# Patient Record
Sex: Female | Born: 1985
Health system: Southern US, Community
[De-identification: ages and names within clinical notes are randomized; demographics above are authoritative.]

## PROBLEM LIST (undated history)

## (undated) DIAGNOSIS — Z973 Presence of spectacles and contact lenses: Secondary | ICD-10-CM

## (undated) DIAGNOSIS — F32A Depression, unspecified: Secondary | ICD-10-CM

## (undated) DIAGNOSIS — D649 Anemia, unspecified: Secondary | ICD-10-CM

## (undated) DIAGNOSIS — N939 Abnormal uterine and vaginal bleeding, unspecified: Secondary | ICD-10-CM

## (undated) DIAGNOSIS — R519 Headache, unspecified: Secondary | ICD-10-CM

## (undated) HISTORY — PX: REDUCTION MAMMAPLASTY: SUR839

## (undated) HISTORY — PX: NO PAST SURGERIES: SHX2092

---

## 2006-04-01 ENCOUNTER — Other Ambulatory Visit: Admission: RE | Admit: 2006-04-01 | Discharge: 2006-04-01 | Payer: Self-pay | Admitting: Family Medicine

## 2007-07-22 ENCOUNTER — Other Ambulatory Visit: Admission: RE | Admit: 2007-07-22 | Discharge: 2007-07-22 | Payer: Self-pay | Admitting: Family Medicine

## 2011-12-11 ENCOUNTER — Other Ambulatory Visit: Payer: Self-pay | Admitting: Family Medicine

## 2011-12-11 ENCOUNTER — Other Ambulatory Visit (HOSPITAL_COMMUNITY)
Admission: RE | Admit: 2011-12-11 | Discharge: 2011-12-11 | Disposition: A | Discharge status: Home / Self Care | Payer: Commercial Managed Care - PPO | Source: Ambulatory Visit | Attending: Family Medicine | Admitting: Family Medicine

## 2011-12-11 DIAGNOSIS — Z124 Encounter for screening for malignant neoplasm of cervix: Secondary | ICD-10-CM | POA: Insufficient documentation

## 2014-07-05 ENCOUNTER — Other Ambulatory Visit (HOSPITAL_COMMUNITY)
Admission: RE | Admit: 2014-07-05 | Discharge: 2014-07-05 | Disposition: A | Discharge status: Home / Self Care | Payer: Commercial Managed Care - PPO | Source: Ambulatory Visit | Attending: Family Medicine | Admitting: Family Medicine

## 2014-07-05 ENCOUNTER — Other Ambulatory Visit: Payer: Self-pay | Admitting: Family Medicine

## 2014-07-05 DIAGNOSIS — Z124 Encounter for screening for malignant neoplasm of cervix: Secondary | ICD-10-CM | POA: Insufficient documentation

## 2014-07-07 LAB — CYTOLOGY - PAP

## 2016-09-02 DIAGNOSIS — Z0000 Encounter for general adult medical examination without abnormal findings: Secondary | ICD-10-CM | POA: Diagnosis not present

## 2016-09-02 DIAGNOSIS — Z13220 Encounter for screening for lipoid disorders: Secondary | ICD-10-CM | POA: Diagnosis not present

## 2016-09-06 DIAGNOSIS — H6983 Other specified disorders of Eustachian tube, bilateral: Secondary | ICD-10-CM | POA: Insufficient documentation

## 2016-09-06 DIAGNOSIS — H905 Unspecified sensorineural hearing loss: Secondary | ICD-10-CM | POA: Insufficient documentation

## 2016-09-06 DIAGNOSIS — H9192 Unspecified hearing loss, left ear: Secondary | ICD-10-CM | POA: Diagnosis not present

## 2016-09-06 DIAGNOSIS — J302 Other seasonal allergic rhinitis: Secondary | ICD-10-CM | POA: Diagnosis not present

## 2016-12-21 DIAGNOSIS — N390 Urinary tract infection, site not specified: Secondary | ICD-10-CM | POA: Diagnosis not present

## 2016-12-21 DIAGNOSIS — R319 Hematuria, unspecified: Secondary | ICD-10-CM | POA: Diagnosis not present

## 2017-03-26 DIAGNOSIS — R8290 Unspecified abnormal findings in urine: Secondary | ICD-10-CM | POA: Diagnosis not present

## 2017-03-26 DIAGNOSIS — E559 Vitamin D deficiency, unspecified: Secondary | ICD-10-CM | POA: Diagnosis not present

## 2017-03-26 DIAGNOSIS — Z23 Encounter for immunization: Secondary | ICD-10-CM | POA: Diagnosis not present

## 2017-03-26 DIAGNOSIS — R5382 Chronic fatigue, unspecified: Secondary | ICD-10-CM | POA: Diagnosis not present

## 2017-05-06 DIAGNOSIS — J0100 Acute maxillary sinusitis, unspecified: Secondary | ICD-10-CM | POA: Diagnosis not present

## 2017-06-27 DIAGNOSIS — H6983 Other specified disorders of Eustachian tube, bilateral: Secondary | ICD-10-CM | POA: Diagnosis not present

## 2017-09-17 DIAGNOSIS — Z5181 Encounter for therapeutic drug level monitoring: Secondary | ICD-10-CM | POA: Diagnosis not present

## 2017-09-17 DIAGNOSIS — Z13220 Encounter for screening for lipoid disorders: Secondary | ICD-10-CM | POA: Diagnosis not present

## 2017-09-17 DIAGNOSIS — E559 Vitamin D deficiency, unspecified: Secondary | ICD-10-CM | POA: Diagnosis not present

## 2017-09-17 DIAGNOSIS — Z0000 Encounter for general adult medical examination without abnormal findings: Secondary | ICD-10-CM | POA: Diagnosis not present

## 2017-12-31 DIAGNOSIS — N3001 Acute cystitis with hematuria: Secondary | ICD-10-CM | POA: Diagnosis not present

## 2018-03-25 DIAGNOSIS — L42 Pityriasis rosea: Secondary | ICD-10-CM | POA: Diagnosis not present

## 2020-04-10 ENCOUNTER — Emergency Department (HOSPITAL_COMMUNITY)
Admission: EM | Admit: 2020-04-10 | Discharge: 2020-04-10 | Disposition: A | Discharge status: Home / Self Care | Payer: Commercial Managed Care - PPO | Attending: Emergency Medicine | Admitting: Emergency Medicine

## 2020-04-10 ENCOUNTER — Other Ambulatory Visit: Payer: Self-pay

## 2020-04-10 ENCOUNTER — Encounter (HOSPITAL_COMMUNITY): Payer: Self-pay

## 2020-04-10 ENCOUNTER — Emergency Department (HOSPITAL_COMMUNITY): Payer: Commercial Managed Care - PPO

## 2020-04-10 DIAGNOSIS — R002 Palpitations: Secondary | ICD-10-CM | POA: Diagnosis not present

## 2020-04-10 DIAGNOSIS — M79602 Pain in left arm: Secondary | ICD-10-CM | POA: Diagnosis not present

## 2020-04-10 DIAGNOSIS — Z5321 Procedure and treatment not carried out due to patient leaving prior to being seen by health care provider: Secondary | ICD-10-CM | POA: Diagnosis not present

## 2020-04-10 DIAGNOSIS — R079 Chest pain, unspecified: Secondary | ICD-10-CM | POA: Diagnosis not present

## 2020-04-10 HISTORY — DX: Depression, unspecified: F32.A

## 2020-04-10 LAB — CBC
HCT: 36.1 % (ref 36.0–46.0)
Hemoglobin: 11.2 g/dL — ABNORMAL LOW (ref 12.0–15.0)
MCH: 27.9 pg (ref 26.0–34.0)
MCHC: 31 g/dL (ref 30.0–36.0)
MCV: 89.8 fL (ref 80.0–100.0)
Platelets: 341 10*3/uL (ref 150–400)
RBC: 4.02 MIL/uL (ref 3.87–5.11)
RDW: 15.1 % (ref 11.5–15.5)
WBC: 8.1 10*3/uL (ref 4.0–10.5)
nRBC: 0 % (ref 0.0–0.2)

## 2020-04-10 LAB — BASIC METABOLIC PANEL
Anion gap: 8 (ref 5–15)
BUN: 6 mg/dL (ref 6–20)
CO2: 24 mmol/L (ref 22–32)
Calcium: 8.6 mg/dL — ABNORMAL LOW (ref 8.9–10.3)
Chloride: 107 mmol/L (ref 98–111)
Creatinine, Ser: 0.66 mg/dL (ref 0.44–1.00)
GFR, Estimated: >60 mL/min (ref >60)
Glucose, Bld: 126 mg/dL — ABNORMAL HIGH (ref 70–99)
Potassium: 3.5 mmol/L (ref 3.5–5.1)
Sodium: 139 mmol/L (ref 135–145)

## 2020-04-10 LAB — I-STAT BETA HCG BLOOD, ED (MC, WL, AP ONLY): I-stat hCG, quantitative: <5 m[IU]/mL (ref <5)

## 2020-04-10 LAB — TROPONIN I (HIGH SENSITIVITY): Troponin I (High Sensitivity): <2 ng/L (ref <18)

## 2020-04-10 NOTE — ED Notes (Signed)
Pt called multiple times, no answer 

## 2020-04-10 NOTE — ED Triage Notes (Signed)
Pt presents with acute onset heart palpitations and pain radiating into Left arm. Pt also reports heavy menstrual cycle consistently x3 months, her OB GYMN placed her on continuous oral BC with no change, pt anemic during her last visit. She also received her Moderna booster Saturday

## 2020-05-27 ENCOUNTER — Other Ambulatory Visit: Payer: Commercial Managed Care - PPO

## 2020-05-27 DIAGNOSIS — Z20822 Contact with and (suspected) exposure to covid-19: Secondary | ICD-10-CM

## 2020-05-30 LAB — NOVEL CORONAVIRUS, NAA: SARS-CoV-2, NAA: NOT DETECTED

## 2020-09-07 NOTE — H&P (Signed)
Erika Fleming is an 35 y.o. G1P0010 who is admitted for Robotic Assisted Total Laparoscopic Hysterectomy with Bilateral Salpingectomy for AUB-L with chronic blood loss anemia.  She has been on COCs ([place]) for greater than 10 years and began to have breakthrough bleeding in April 2021. Bleeding persistent with skipping placebo pills.  She was counseled on other forms of contraception to aid with bleeding, including LNG-IUD and medications that may aid in decrease in bleeding such as Oriahnn/Myfembre. She was also counseled on other surgical options such as endometrial ablation/UAE. Patient declined all other options and desires definitive surgical management.  She has made it very clear that she does not desire future fertility and is 100% positive about this.  Work-up: Pap smear (01/2020):  NILM/HRHPV negative TSH (01/2020):  2.00 (wnl) CBC (01/2020): 8.2>9.6/31.1<449 EMB (06/2020):  Very rare benign endometrial fragments are seen in a specimen that consists mainly of fragments of benign endocervical glands, mucus, and abundant blood. TVUS (01/2020):  Uterus 8.43 x 3.68 x 5.02cm  Fibroid 1: 1.98cm, Fibroid 2: 1.41cm, Fibroid 3: 3.18cm, Fibroid 4: 1.54cm  R ovary: 3.08cm, L ovary 3.20cm  Anteverted uterus.  Uterine fibroids - midbody submucosal 2.0 x 2.0 x 1.7cm, midbody submucosal 1.4 x 1.3 x 1.3cm, fundal 3.2 x 2.7 x 2.4cm, fundal pedunculated 1.5 x 1.5 x 1.2cm.  Endometrium difficult to clearly visualize due to submucosal midbody fibroids.  Fibroids push on and distort endometrial cavity.  Bilateral ovaries wnl. No adnexal masses seen.  Patient Active Problem List   Diagnosis Date Noted  . Abnormal uterine bleeding (AUB) 09/08/2020  . Chronic blood loss anemia 09/08/2020  . Chronic seasonal allergic rhinitis 09/06/2016  . Congenital hearing loss of left ear 09/06/2016  . ETD (Eustachian tube dysfunction), bilateral 09/06/2016    MEDICAL/FAMILY/SOCIAL HX: No LMP recorded. (Menstrual status:  Irregular Periods).    Past Medical History:  Diagnosis Date  . Depression     No past surgical history on file.  No family history on file.  Social History:  reports that she has never smoked. She has never used smokeless tobacco. She reports that she does not drink alcohol and does not use drugs.  ALLERGIES/MEDS:  Allergies: No Known Allergies  No medications prior to admission.     Review of Systems  Constitutional: Negative.   HENT: Negative.   Eyes: Negative.   Respiratory: Negative.   Cardiovascular: Negative.   Gastrointestinal: Negative.   Genitourinary: Negative.   Musculoskeletal: Negative.   Skin: Negative.   Neurological: Negative.   Endo/Heme/Allergies: Negative.   Psychiatric/Behavioral: Negative.     There were no vitals taken for this visit.  Gen:  NAD, pleasant Cardio:  RRR Lungs:  CTAB Abd: Soft, non-distended, non-tender throughout Ext:  No bilateral LE edema Pelvic: Labia- unremarkable, vagina - pink moist mucosa, no lesions or abnormal discharge, cervix - no discharge or lesions or CMT, on bimanual exam uterus is normal in size and is non-tender, no CMT, no adnexal fullness/masses  No results found for this or any previous visit (from the past 24 hour(s)).  No results found.   ASSESSMENT/PLAN: Erika Fleming is a 35 y.o. G1P0010  who is admitted for Robotic Assisted Total Laparoscopic Hysterectomy with Bilateral Salpingectomy for AUB-L with chronic blood loss anemia.  - Admit to [facility] - Admit labs (CBC, T&S, COVID screen) - Diet:  NPO - IVF:  Per anesthesia - VTE Prophylaxis:  SCDs - Antibiotics:  Ancef 2g on call to OR - D/C home POD#1  Consents:  I have explained to the patient that this surgery is performed to remove the uterus through several small incisions in the abdomen and that it will result in sterility.  I discussed the risks and benefits of the surgery, including, but not limited to bleeding, including the need for a blood  transfusion, infection, damage to surrounding organs and tissues, damage to bladder, damage to ureters, causing kidney damage, and requiring additional procedures, damage to bowels, resulting in further surgery, postoperative pain, short-term and long-term, scarring on the abdominal wall and intra-abdominally, need for further surgery, need for conversion to an open procedure, development of an incisional hernia, deep vein thrombosis and/or pulmonary embolism, wound infection and/or separation, painful intercourse, urinary leakage, ovarian failure, resulting in menopausal symptoms requiring treatment, fistula formation, complications the course of which cannot be predicted or prevented, and death. Patient was counseled on prophylactic oophorectomy versus ovarian conservation, understanding that ovarian conservation carries a lifetime risk of ovarian cancer of 1 in 52 and a 5-10% risk of reoperation in the future for ovarian pathology.  She understands that prophylactic oophorectomy may result in menopausal symptoms resulting in the need to hormone replacement therapy following surgery.  The patient opts for ovarian conservation. She did, however, give consent for oophoprectomy for abnormal findings that would warrant oophorectomy at the time of surgery. Patient consented for blood products. The pt is aware that bleeding may result in the need for a blood transfusion including HIV (1: 2 million), Hepatitis C (1:2 million), and Hepatitis B (1:200 thousand) and transfusion reaction. Pt voiced understanding of above risks as well as understanding of the indications for blood transfusion.   Drema Dallas, DO [phone] (office)

## 2020-09-08 ENCOUNTER — Encounter (HOSPITAL_BASED_OUTPATIENT_CLINIC_OR_DEPARTMENT_OTHER): Payer: Self-pay | Admitting: Obstetrics and Gynecology

## 2020-09-08 DIAGNOSIS — N939 Abnormal uterine and vaginal bleeding, unspecified: Secondary | ICD-10-CM

## 2020-09-08 DIAGNOSIS — D500 Iron deficiency anemia secondary to blood loss (chronic): Secondary | ICD-10-CM

## 2020-09-12 ENCOUNTER — Other Ambulatory Visit: Payer: Self-pay

## 2020-09-12 ENCOUNTER — Encounter (HOSPITAL_BASED_OUTPATIENT_CLINIC_OR_DEPARTMENT_OTHER): Payer: Self-pay | Admitting: Obstetrics and Gynecology

## 2020-09-12 NOTE — Progress Notes (Incomplete Revision)
Spoke w/ via phone for pre-op interview---pt Lab needs dos----    Urine poct           Lab results------has lab appt 09-18-2020 900 for cbc t & s  COVID test ------09-18-2020 1030 Arrive at -------630 am 09-20-2020 NPO after MN NO Solid Food.  Clear liquids from MN until---530 am then npo Med rec completed Medications to take morning of surgery -----sprintec, citalopram, loratadine, eye drop prn Diabetic medication -----n/a Patient instructed to bring photo id and insurance card day of surgery Patient aware to have Driver (ride ) / caregiver spouse willie    for 24 hours after surgery  Patient Special Instructions -----none Pre-Op special Istructions -----none Patient verbalized understanding of instructions that were given at this phone interview. Patient denies shortness of breath, chest pain, fever, cough at this phone interview.  ekg 04-10-2020 epic  chest xray 04-10-2020 epic Sleep study 2 weeks ago at [facility] normal per pt

## 2020-09-12 NOTE — Progress Notes (Addendum)
Spoke w/ via phone for pre-op interview---pt Lab needs dos----    Urine poct           Lab results------has lab appt 09-18-2020 900 for cbc t & s  COVID test ------09-18-2020 1030 Arrive at -------630 am 09-20-2020 NPO after MN NO Solid Food.  Clear liquids from MN until---530 am then npo Med rec completed Medications to take morning of surgery -----sprintec, citalopram, loratadine, eye drop prn Diabetic medication -----n/a Patient instructed to bring photo id and insurance card day of surgery Patient aware to have Driver (ride ) / caregiver spouse willie    for 24 hours after surgery  Patient Special Instructions -----none Pre-Op special Istructions -----none Patient verbalized understanding of instructions that were given at this phone interview. Patient denies shortness of breath, chest pain, fever, cough at this phone interview.  ekg 04-10-2020 epic  chest xray 04-10-2020 epic abnormal reviewed with dr Marcello Moores brock mda and need note from pt pcp stating aware of abnormal chest xray 04-10-2020 and addressing the issue , called dr Delora Fuel office and spoke with [facility] scheduler and made aware of abnormal chest xray 04-10-2020 and need note from pcp addressing issue per dr Marcello Moores brock mda Sleep study 2 weeks ago at [facility] normal per pt  Addendum: medical clearance dr Arbie Cookey webb dated  09-15-2020 received and on chart for 09-20-2020 surgery

## 2020-09-12 NOTE — Progress Notes (Signed)
YOU ARE SCHEDULED FOR A COVID TEST  09-18-2020  @ 1030 AM.THIS TEST MUST BE DONE BEFORE SURGERY. GO TO  [place]. [place], [place], IT IS APPROXIMATELY 2 MINUTES PAST [employer] ON THE RIGHT AND REMAIN IN YOUR CAR, THIS IS A DRIVE UP TEST. ONCE YOUR COVID TEST IS DONE PLEASE FOLLOW ALL THE QUARANTINE  INSTRUCTIONS GIVEN IN YOUR HANDOUT.      Your procedure is scheduled on 09-20-2020  Report to [facility]   Call this number if you have problems the morning of surgery  :[phone].   OUR ADDRESS IS [place].  WE ARE LOCATED IN [facility].  PLEASE BRING YOUR INSURANCE CARD AND PHOTO ID DAY OF SURGERY.  ONLY ONE PERSON ALLOWED IN FACILITY WAITING AREA.                                     REMEMBER:  DO NOT EAT FOOD, CANDY GUM OR MINTS  AFTER MIDNIGHT . YOU MAY HAVE CLEAR LIQUIDS FROM MIDNIGHT UNTIL 530 AM. NO CLEAR LIQUIDS AFTER 530 AM DAY OF SURGERY.   YOU MAY  BRUSH YOUR TEETH MORNING OF SURGERY AND RINSE YOUR MOUTH OUT, NO CHEWING GUM CANDY OR MINTS.    CLEAR LIQUID DIET   Foods Allowed                                                                     Foods Excluded  Coffee and tea, regular and decaf                             liquids that you cannot  Plain Jell-O any favor except red or purple                                           see through such as: Fruit ices (not with fruit pulp)                                     milk, soups, orange juice  Iced Popsicles                                    All solid food Carbonated beverages, regular and diet                                    Cranberry, grape and apple juices Sports drinks like Gatorade Lightly seasoned clear broth or consume(fat free) Sugar, honey syrup  Sample Menu Breakfast                                Lunch  Supper Cranberry juice                    Beef broth                            Chicken  broth Jell-O                                     Grape juice                           Apple juice Coffee or tea                        Jell-O                                      Popsicle                                                Coffee or tea                        Coffee or tea  _____________________________________________________________________     TAKE THESE MEDICATIONS MORNING OF SURGERY WITH A SIP OF WATER:SPRINTEC, CITALOPRAM, LORATADINE, EYE DROP IF NEEDED  (BRING EYE DROP WITH YOU DAY OF SURGERY)  ONE VISITOR IS ALLOWED IN WAITING ROOM ONLY DAY OF SURGERY.  NO VISITOR MAY SPEND THE NIGHT.  VISITOR ARE ALLOWED TO STAY UNTIL 800 PM.                                    DO NOT WEAR JEWERLY, MAKE UP. DO NOT WEAR LOTIONS, POWDERS, PERFUMES OR DEODORANT. DO NOT SHAVE FOR 24 HOURS PRIOR TO DAY OF SURGERY. MEN MAY SHAVE FACE AND NECK. CONTACTS, GLASSES, OR DENTURES MAY NOT BE WORN TO SURGERY.                                    [facility] IS NOT RESPONSIBLE  FOR ANY BELONGINGS.                                                                    Marland Kitchen           [place] - Preparing for Surgery Before surgery, you can play an important role.  Because skin is not sterile, your skin needs to be as free of germs as possible.  You can reduce the number of germs on your skin by washing with CHG (chlorahexidine gluconate) soap before surgery.  CHG is an antiseptic cleaner which kills germs and bonds with the skin to continue killing germs even after washing. Please DO NOT use if you have an  allergy to CHG or antibacterial soaps.  If your skin becomes reddened/irritated stop using the CHG and inform your nurse when you arrive at Short Stay. Do not shave (including legs and underarms) for at least 48 hours prior to the first CHG shower.  You may shave your face/neck. Please follow these instructions carefully:  1.  Shower with CHG Soap the night before surgery and the  morning of Surgery.  2.   If you choose to wash your hair, wash your hair first as usual with your  normal  shampoo.  3.  After you shampoo, rinse your hair and body thoroughly to remove the  shampoo.                            4.  Use CHG as you would any other liquid soap.  You can apply chg directly  to the skin and wash                      Gently with a scrungie or clean washcloth.  5.  Apply the CHG Soap to your body ONLY FROM THE NECK DOWN.   Do not use on face/ open                           Wound or open sores. Avoid contact with eyes, ears mouth and genitals (private parts).                       Wash face,  Genitals (private parts) with your normal soap.             6.  Wash thoroughly, paying special attention to the area where your surgery  will be performed.  7.  Thoroughly rinse your body with warm water from the neck down.  8.  DO NOT shower/wash with your normal soap after using and rinsing off  the CHG Soap.                9.  Pat yourself dry with a clean towel.            10.  Wear clean pajamas.            11.  Place clean sheets on your bed the night of your first shower and do not  sleep with pets. Day of Surgery : Do not apply any lotions/deodorants the morning of surgery.  Please wear clean clothes to the hospital/surgery center.  FAILURE TO FOLLOW THESE INSTRUCTIONS MAY RESULT IN THE CANCELLATION OF YOUR SURGERY PATIENT SIGNATURE_________________________________  NURSE SIGNATURE__________________________________  ________________________________________________________________________                                                        QUESTIONS Hansel Feinstein PRE OP NURSE PHONE [phone]

## 2020-09-14 ENCOUNTER — Institutional Professional Consult (permissible substitution): Payer: Commercial Managed Care - PPO | Admitting: Plastic Surgery

## 2020-09-18 ENCOUNTER — Encounter (HOSPITAL_COMMUNITY)
Admission: RE | Admit: 2020-09-18 | Discharge: 2020-09-18 | Disposition: A | Discharge status: Home / Self Care | Payer: Commercial Managed Care - PPO | Source: Ambulatory Visit | Attending: Obstetrics and Gynecology | Admitting: Obstetrics and Gynecology

## 2020-09-18 ENCOUNTER — Other Ambulatory Visit: Payer: Self-pay

## 2020-09-18 ENCOUNTER — Other Ambulatory Visit (HOSPITAL_COMMUNITY)
Admission: RE | Admit: 2020-09-18 | Discharge: 2020-09-18 | Disposition: A | Discharge status: Home / Self Care | Payer: Commercial Managed Care - PPO | Source: Ambulatory Visit | Attending: Obstetrics and Gynecology | Admitting: Obstetrics and Gynecology

## 2020-09-18 DIAGNOSIS — Z20822 Contact with and (suspected) exposure to covid-19: Secondary | ICD-10-CM | POA: Insufficient documentation

## 2020-09-18 DIAGNOSIS — Z01818 Encounter for other preprocedural examination: Secondary | ICD-10-CM | POA: Insufficient documentation

## 2020-09-18 LAB — CBC
HCT: 33 % — ABNORMAL LOW (ref 36.0–46.0)
Hemoglobin: 9.8 g/dL — ABNORMAL LOW (ref 12.0–15.0)
MCH: 24.7 pg — ABNORMAL LOW (ref 26.0–34.0)
MCHC: 29.7 g/dL — ABNORMAL LOW (ref 30.0–36.0)
MCV: 83.1 fL (ref 80.0–100.0)
Platelets: 368 10*3/uL (ref 150–400)
RBC: 3.97 MIL/uL (ref 3.87–5.11)
RDW: 19.3 % — ABNORMAL HIGH (ref 11.5–15.5)
WBC: 7.7 10*3/uL (ref 4.0–10.5)
nRBC: 0 % (ref 0.0–0.2)

## 2020-09-18 LAB — SARS CORONAVIRUS 2 (TAT 6-24 HRS): SARS Coronavirus 2: NEGATIVE

## 2020-09-20 ENCOUNTER — Ambulatory Visit (HOSPITAL_BASED_OUTPATIENT_CLINIC_OR_DEPARTMENT_OTHER): Payer: Commercial Managed Care - PPO | Admitting: Anesthesiology

## 2020-09-20 ENCOUNTER — Ambulatory Visit (HOSPITAL_BASED_OUTPATIENT_CLINIC_OR_DEPARTMENT_OTHER)
Admission: RE | Admit: 2020-09-20 | Discharge: 2020-09-21 | Disposition: A | Discharge status: Home / Self Care | Payer: Commercial Managed Care - PPO | Source: Ambulatory Visit | Attending: Obstetrics and Gynecology | Admitting: Obstetrics and Gynecology

## 2020-09-20 ENCOUNTER — Encounter (HOSPITAL_BASED_OUTPATIENT_CLINIC_OR_DEPARTMENT_OTHER): Payer: Self-pay | Admitting: Obstetrics and Gynecology

## 2020-09-20 ENCOUNTER — Encounter (HOSPITAL_BASED_OUTPATIENT_CLINIC_OR_DEPARTMENT_OTHER)
Admission: RE | Disposition: A | Discharge status: Home / Self Care | Payer: Self-pay | Source: Ambulatory Visit | Attending: Obstetrics and Gynecology

## 2020-09-20 DIAGNOSIS — D259 Leiomyoma of uterus, unspecified: Secondary | ICD-10-CM | POA: Insufficient documentation

## 2020-09-20 DIAGNOSIS — N939 Abnormal uterine and vaginal bleeding, unspecified: Secondary | ICD-10-CM

## 2020-09-20 DIAGNOSIS — D500 Iron deficiency anemia secondary to blood loss (chronic): Secondary | ICD-10-CM | POA: Diagnosis not present

## 2020-09-20 HISTORY — DX: Anemia, unspecified: D64.9

## 2020-09-20 HISTORY — DX: Abnormal uterine and vaginal bleeding, unspecified: N93.9

## 2020-09-20 HISTORY — DX: Presence of spectacles and contact lenses: Z97.3

## 2020-09-20 HISTORY — PX: ROBOTIC ASSISTED LAPAROSCOPIC HYSTERECTOMY AND SALPINGECTOMY: SHX6379

## 2020-09-20 HISTORY — DX: Headache, unspecified: R51.9

## 2020-09-20 LAB — POCT PREGNANCY, URINE: Preg Test, Ur: NEGATIVE

## 2020-09-20 LAB — TYPE AND SCREEN
ABO/RH(D): A POS
Antibody Screen: NEGATIVE

## 2020-09-20 LAB — ABO/RH: ABO/RH(D): A POS

## 2020-09-20 SURGERY — XI ROBOTIC ASSISTED LAPAROSCOPIC HYSTERECTOMY AND SALPINGECTOMY
Anesthesia: General | Site: Abdomen | Laterality: Bilateral

## 2020-09-20 MED ORDER — HEMOSTATIC AGENTS (NO CHARGE) OPTIME
TOPICAL | Status: DC | PRN
  Administered 2020-09-20: 1 via TOPICAL

## 2020-09-20 MED ORDER — ONDANSETRON HCL 4 MG/2ML IJ SOLN
INTRAMUSCULAR | Status: AC
  Filled 2020-09-20: qty 2

## 2020-09-20 MED ORDER — DEXAMETHASONE SODIUM PHOSPHATE 10 MG/ML IJ SOLN
INTRAMUSCULAR | Status: DC | PRN
  Administered 2020-09-20: 8 mg via INTRAVENOUS

## 2020-09-20 MED ORDER — LIDOCAINE 2% (20 MG/ML) 5 ML SYRINGE
INTRAMUSCULAR | Status: AC
  Filled 2020-09-20: qty 5

## 2020-09-20 MED ORDER — PANTOPRAZOLE SODIUM 40 MG PO TBEC
DELAYED_RELEASE_TABLET | ORAL | Status: AC
  Filled 2020-09-20: qty 1

## 2020-09-20 MED ORDER — ONDANSETRON HCL 4 MG/2ML IJ SOLN
INTRAMUSCULAR | Status: DC | PRN
  Administered 2020-09-20: 4 mg via INTRAVENOUS

## 2020-09-20 MED ORDER — ONDANSETRON HCL 4 MG/2ML IJ SOLN: 4.0000 mg | Freq: Four times a day (QID) | INTRAMUSCULAR | Status: DC | PRN

## 2020-09-20 MED ORDER — MORPHINE SULFATE (PF) 2 MG/ML IV SOLN
INTRAVENOUS | Status: AC
  Filled 2020-09-20: qty 1

## 2020-09-20 MED ORDER — PANTOPRAZOLE SODIUM 40 MG IV SOLR
40.0000 mg | Freq: Every day | INTRAVENOUS | Status: DC
  Administered 2020-09-20: 40 mg via INTRAVENOUS

## 2020-09-20 MED ORDER — SIMETHICONE 80 MG PO CHEW
80.0000 mg | CHEWABLE_TABLET | Freq: Four times a day (QID) | ORAL | Status: DC | PRN
  Administered 2020-09-20 (×2): 80 mg via ORAL

## 2020-09-20 MED ORDER — FENTANYL CITRATE (PF) 100 MCG/2ML IJ SOLN
INTRAMUSCULAR | Status: AC
  Filled 2020-09-20: qty 2

## 2020-09-20 MED ORDER — ACETAMINOPHEN 500 MG PO TABS
ORAL_TABLET | ORAL | Status: AC
  Filled 2020-09-20: qty 2

## 2020-09-20 MED ORDER — SUGAMMADEX SODIUM 200 MG/2ML IV SOLN
INTRAVENOUS | Status: DC | PRN
  Administered 2020-09-20: 200 mg via INTRAVENOUS

## 2020-09-20 MED ORDER — MIDAZOLAM HCL 5 MG/5ML IJ SOLN
INTRAMUSCULAR | Status: DC | PRN
  Administered 2020-09-20: 2 mg via INTRAVENOUS

## 2020-09-20 MED ORDER — CEFAZOLIN SODIUM-DEXTROSE 2-4 GM/100ML-% IV SOLN
2.0000 g | INTRAVENOUS | Status: AC
  Administered 2020-09-20: 2 g via INTRAVENOUS

## 2020-09-20 MED ORDER — OXYCODONE HCL 5 MG PO TABS
ORAL_TABLET | ORAL | Status: AC
  Filled 2020-09-20: qty 2

## 2020-09-20 MED ORDER — OXYCODONE HCL 5 MG PO TABS
5.0000 mg | ORAL_TABLET | ORAL | Status: DC | PRN
  Administered 2020-09-20: 5 mg via ORAL
  Administered 2020-09-20 (×2): 10 mg via ORAL
  Administered 2020-09-20: 5 mg via ORAL
  Administered 2020-09-21 (×2): 10 mg via ORAL

## 2020-09-20 MED ORDER — KETOROLAC TROMETHAMINE 30 MG/ML IJ SOLN
INTRAMUSCULAR | Status: DC | PRN
  Administered 2020-09-20: 30 mg via INTRAVENOUS

## 2020-09-20 MED ORDER — OXYCODONE HCL 5 MG/5ML PO SOLN: 5.0000 mg | Freq: Once | ORAL | Status: DC | PRN

## 2020-09-20 MED ORDER — DEXAMETHASONE SODIUM PHOSPHATE 10 MG/ML IJ SOLN
INTRAMUSCULAR | Status: AC
  Filled 2020-09-20: qty 1

## 2020-09-20 MED ORDER — LORATADINE 10 MG PO TABS: 10.0000 mg | ORAL_TABLET | Freq: Every day | ORAL | Status: DC

## 2020-09-20 MED ORDER — MORPHINE SULFATE (PF) 4 MG/ML IV SOLN: 1.0000 mg | INTRAVENOUS | Status: DC | PRN

## 2020-09-20 MED ORDER — SIMETHICONE 80 MG PO CHEW
CHEWABLE_TABLET | ORAL | Status: AC
  Filled 2020-09-20: qty 1

## 2020-09-20 MED ORDER — PROPOFOL 10 MG/ML IV BOLUS
INTRAVENOUS | Status: AC
  Filled 2020-09-20: qty 40

## 2020-09-20 MED ORDER — AMISULPRIDE (ANTIEMETIC) 5 MG/2ML IV SOLN: 10.0000 mg | Freq: Once | INTRAVENOUS | Status: DC | PRN

## 2020-09-20 MED ORDER — FERROUS SULFATE 325 (65 FE) MG PO TABS
325.0000 mg | ORAL_TABLET | Freq: Every day | ORAL | Status: DC
  Filled 2020-09-20 (×3): qty 1

## 2020-09-20 MED ORDER — LACTATED RINGERS IV SOLN: INTRAVENOUS | Status: DC

## 2020-09-20 MED ORDER — ACETAMINOPHEN 500 MG PO TABS
1000.0000 mg | ORAL_TABLET | Freq: Once | ORAL | Status: AC
  Administered 2020-09-20: 1000 mg via ORAL

## 2020-09-20 MED ORDER — FENTANYL CITRATE (PF) 250 MCG/5ML IJ SOLN
INTRAMUSCULAR | Status: AC
  Filled 2020-09-20: qty 5

## 2020-09-20 MED ORDER — CEFAZOLIN SODIUM-DEXTROSE 2-4 GM/100ML-% IV SOLN
INTRAVENOUS | Status: AC
  Filled 2020-09-20: qty 100

## 2020-09-20 MED ORDER — MIDAZOLAM HCL 2 MG/2ML IJ SOLN
INTRAMUSCULAR | Status: AC
  Filled 2020-09-20: qty 2

## 2020-09-20 MED ORDER — KETOROLAC TROMETHAMINE 30 MG/ML IJ SOLN: 30.0000 mg | Freq: Once | INTRAMUSCULAR | Status: DC | PRN

## 2020-09-20 MED ORDER — LIDOCAINE 2% (20 MG/ML) 5 ML SYRINGE
INTRAMUSCULAR | Status: DC | PRN
  Administered 2020-09-20: 60 mg via INTRAVENOUS

## 2020-09-20 MED ORDER — PROPOFOL 10 MG/ML IV BOLUS
INTRAVENOUS | Status: DC | PRN
  Administered 2020-09-20: 150 mg via INTRAVENOUS

## 2020-09-20 MED ORDER — PROMETHAZINE HCL 25 MG/ML IJ SOLN: 6.2500 mg | INTRAMUSCULAR | Status: DC | PRN

## 2020-09-20 MED ORDER — FENTANYL CITRATE (PF) 100 MCG/2ML IJ SOLN
25.0000 ug | INTRAMUSCULAR | Status: DC | PRN
  Administered 2020-09-20 (×2): 25 ug via INTRAVENOUS

## 2020-09-20 MED ORDER — SODIUM CHLORIDE 0.9 % IV SOLN
INTRAVENOUS | Status: DC | PRN
  Administered 2020-09-20: 100 mL

## 2020-09-20 MED ORDER — FENTANYL CITRATE (PF) 100 MCG/2ML IJ SOLN
INTRAMUSCULAR | Status: DC | PRN
  Administered 2020-09-20: 25 ug via INTRAVENOUS
  Administered 2020-09-20: 50 ug via INTRAVENOUS
  Administered 2020-09-20: 100 ug via INTRAVENOUS

## 2020-09-20 MED ORDER — SCOPOLAMINE 1 MG/3DAYS TD PT72
MEDICATED_PATCH | TRANSDERMAL | Status: AC
  Filled 2020-09-20: qty 1

## 2020-09-20 MED ORDER — ROCURONIUM BROMIDE 10 MG/ML (PF) SYRINGE
PREFILLED_SYRINGE | INTRAVENOUS | Status: AC
  Filled 2020-09-20: qty 10

## 2020-09-20 MED ORDER — DEXMEDETOMIDINE (PRECEDEX) IN NS 20 MCG/5ML (4 MCG/ML) IV SYRINGE
PREFILLED_SYRINGE | INTRAVENOUS | Status: AC
  Filled 2020-09-20: qty 10

## 2020-09-20 MED ORDER — OXYCODONE HCL 5 MG PO TABS
ORAL_TABLET | ORAL | Status: AC
  Filled 2020-09-20: qty 1

## 2020-09-20 MED ORDER — IBUPROFEN 800 MG PO TABS
800.0000 mg | ORAL_TABLET | Freq: Three times a day (TID) | ORAL | Status: DC
  Administered 2020-09-20 – 2020-09-21 (×2): 800 mg via ORAL

## 2020-09-20 MED ORDER — ACETAMINOPHEN 500 MG PO TABS
1000.0000 mg | ORAL_TABLET | Freq: Four times a day (QID) | ORAL | Status: DC
  Administered 2020-09-20 – 2020-09-21 (×3): 1000 mg via ORAL

## 2020-09-20 MED ORDER — OXYCODONE HCL 5 MG PO TABS: 5.0000 mg | ORAL_TABLET | Freq: Once | ORAL | Status: DC | PRN

## 2020-09-20 MED ORDER — SCOPOLAMINE 1 MG/3DAYS TD PT72
1.0000 | MEDICATED_PATCH | TRANSDERMAL | Status: DC
  Administered 2020-09-20: 1.5 mg via TRANSDERMAL

## 2020-09-20 MED ORDER — CITALOPRAM HYDROBROMIDE 10 MG PO TABS
10.0000 mg | ORAL_TABLET | Freq: Every day | ORAL | Status: DC
  Filled 2020-09-20: qty 1

## 2020-09-20 MED ORDER — DEXMEDETOMIDINE (PRECEDEX) IN NS 20 MCG/5ML (4 MCG/ML) IV SYRINGE
PREFILLED_SYRINGE | INTRAVENOUS | Status: DC | PRN
  Administered 2020-09-20 (×2): 8 ug via INTRAVENOUS
  Administered 2020-09-20 (×2): 4 ug via INTRAVENOUS
  Administered 2020-09-20 (×2): 8 ug via INTRAVENOUS
  Administered 2020-09-20: 4 ug via INTRAVENOUS

## 2020-09-20 MED ORDER — IBUPROFEN 800 MG PO TABS
ORAL_TABLET | ORAL | Status: AC
  Filled 2020-09-20: qty 1

## 2020-09-20 MED ORDER — PANTOPRAZOLE SODIUM 40 MG IV SOLR
INTRAVENOUS | Status: AC
  Filled 2020-09-20: qty 40

## 2020-09-20 MED ORDER — KETOROLAC TROMETHAMINE 30 MG/ML IJ SOLN
INTRAMUSCULAR | Status: AC
  Filled 2020-09-20: qty 1

## 2020-09-20 MED ORDER — ROCURONIUM BROMIDE 10 MG/ML (PF) SYRINGE
PREFILLED_SYRINGE | INTRAVENOUS | Status: DC | PRN
  Administered 2020-09-20: 60 mg via INTRAVENOUS
  Administered 2020-09-20: 20 mg via INTRAVENOUS

## 2020-09-20 MED ORDER — SODIUM CHLORIDE 0.9 % IV SOLN: INTRAVENOUS | Status: DC | PRN

## 2020-09-20 MED ORDER — ONDANSETRON HCL 4 MG PO TABS: 4.0000 mg | ORAL_TABLET | Freq: Four times a day (QID) | ORAL | Status: DC | PRN

## 2020-09-20 MED ORDER — SODIUM CHLORIDE 0.9 % IR SOLN
Status: DC | PRN
  Administered 2020-09-20: 600 mL

## 2020-09-20 SURGICAL SUPPLY — 70 items
APPLICATOR ARISTA FLEXITIP XL (MISCELLANEOUS) ×2 IMPLANT
BARRIER ADHS 3X4 INTERCEED (GAUZE/BANDAGES/DRESSINGS) IMPLANT
CATH FOLEY 3WAY  5CC 16FR (CATHETERS) ×2
CATH FOLEY 3WAY 5CC 16FR (CATHETERS) ×1 IMPLANT
CELLS DAT CNTRL 66122 CELL SVR (MISCELLANEOUS) IMPLANT
COVER BACK TABLE 60X90IN (DRAPES) ×2 IMPLANT
COVER TIP SHEARS 8 DVNC (MISCELLANEOUS) ×1 IMPLANT
COVER TIP SHEARS 8MM DA VINCI (MISCELLANEOUS) ×2
DECANTER SPIKE VIAL GLASS SM (MISCELLANEOUS) ×4 IMPLANT
DEFOGGER SCOPE WARMER CLEARIFY (MISCELLANEOUS) ×2 IMPLANT
DERMABOND ADVANCED (GAUZE/BANDAGES/DRESSINGS) ×1
DERMABOND ADVANCED .7 DNX12 (GAUZE/BANDAGES/DRESSINGS) ×1 IMPLANT
DILATOR CANAL MILEX (MISCELLANEOUS) ×2 IMPLANT
DRAPE ARM DVNC X/XI (DISPOSABLE) ×4 IMPLANT
DRAPE COLUMN DVNC XI (DISPOSABLE) ×1 IMPLANT
DRAPE DA VINCI XI ARM (DISPOSABLE) ×8
DRAPE DA VINCI XI COLUMN (DISPOSABLE) ×2
DRAPE UTILITY 15X26 TOWEL STRL (DRAPES) ×2 IMPLANT
DRAPE UTILITY XL STRL (DRAPES) ×2 IMPLANT
DURAPREP 26ML APPLICATOR (WOUND CARE) ×2 IMPLANT
ELECT REM PT RETURN 9FT ADLT (ELECTROSURGICAL) ×2
ELECTRODE REM PT RTRN 9FT ADLT (ELECTROSURGICAL) ×1 IMPLANT
GLOVE SURG ENC TEXT LTX SZ6.5 (GLOVE) ×6 IMPLANT
GLOVE SURG UNDER POLY LF SZ6.5 (GLOVE) ×12 IMPLANT
HEMOSTAT ARISTA ABSORB 3G PWDR (HEMOSTASIS) ×2 IMPLANT
HOLDER FOLEY CATH W/STRAP (MISCELLANEOUS) ×2 IMPLANT
IRRIG SUCT STRYKERFLOW 2 WTIP (MISCELLANEOUS) ×2
IRRIGATION SUCT STRKRFLW 2 WTP (MISCELLANEOUS) ×1 IMPLANT
IV NS 1000ML (IV SOLUTION) ×2
IV NS 1000ML BAXH (IV SOLUTION) ×1 IMPLANT
KIT TURNOVER CYSTO (KITS) ×2 IMPLANT
LEGGING LITHOTOMY PAIR STRL (DRAPES) ×2 IMPLANT
NS IRRIG 500ML POUR BTL (IV SOLUTION) ×2 IMPLANT
OBTURATOR OPTICAL STANDARD 8MM (TROCAR)
OBTURATOR OPTICAL STND 8 DVNC (TROCAR)
OBTURATOR OPTICALSTD 8 DVNC (TROCAR) IMPLANT
OCCLUDER COLPOPNEUMO (BALLOONS) ×2 IMPLANT
PACK ROBOT WH (CUSTOM PROCEDURE TRAY) ×2 IMPLANT
PACK ROBOTIC GOWN (GOWN DISPOSABLE) ×2 IMPLANT
PACK TRENDGUARD 450 HYBRID PRO (MISCELLANEOUS) IMPLANT
PAD OB MATERNITY 4.3X12.25 (PERSONAL CARE ITEMS) ×2 IMPLANT
PAD PREP 24X48 CUFFED NSTRL (MISCELLANEOUS) ×2 IMPLANT
PROTECTOR NERVE ULNAR (MISCELLANEOUS) ×2 IMPLANT
RTRCTR WOUND ALEXIS 18CM MED (MISCELLANEOUS)
RTRCTR WOUND ALEXIS 18CM SML (INSTRUMENTS)
SAVER CELL AAL HAEMONETICS (INSTRUMENTS) IMPLANT
SCISSORS LAP 5X45 EPIX DISP (ENDOMECHANICALS) IMPLANT
SEAL CANN UNIV 5-8 DVNC XI (MISCELLANEOUS) ×4 IMPLANT
SEAL XI 5MM-8MM UNIVERSAL (MISCELLANEOUS) ×8
SEALER VESSEL DA VINCI XI (MISCELLANEOUS) ×1
SEALER VESSEL EXT DVNC XI (MISCELLANEOUS) ×1 IMPLANT
SET IRRIG Y TYPE TUR BLADDER L (SET/KITS/TRAYS/PACK) IMPLANT
SET TRI-LUMEN FLTR TB AIRSEAL (TUBING) ×2 IMPLANT
SOL ANTI FOG 6CC (MISCELLANEOUS) ×1 IMPLANT
SOLUTION ANTI FOG 6CC (MISCELLANEOUS) ×1
SUT VIC AB 0 CT1 27 (SUTURE) ×4
SUT VIC AB 0 CT1 27XBRD ANBCTR (SUTURE) ×2 IMPLANT
SUT VICRYL 0 UR6 27IN ABS (SUTURE) IMPLANT
SUT VICRYL RAPIDE 4/0 PS 2 (SUTURE) ×6 IMPLANT
SUT VLOC 180 0 9IN  GS21 (SUTURE) ×4
SUT VLOC 180 0 9IN GS21 (SUTURE) ×2 IMPLANT
TIP RUMI ORANGE 6.7MMX12CM (TIP) IMPLANT
TIP UTERINE 5.1X6CM LAV DISP (MISCELLANEOUS) IMPLANT
TIP UTERINE 6.7X10CM GRN DISP (MISCELLANEOUS) IMPLANT
TIP UTERINE 6.7X6CM WHT DISP (MISCELLANEOUS) IMPLANT
TIP UTERINE 6.7X8CM BLUE DISP (MISCELLANEOUS) ×2 IMPLANT
TOWEL OR 17X26 10 PK STRL BLUE (TOWEL DISPOSABLE) ×2 IMPLANT
TRENDGUARD 450 HYBRID PRO PACK (MISCELLANEOUS)
TROCAR PORT AIRSEAL 8X120 (TROCAR) ×2 IMPLANT
WATER STERILE IRR 1000ML POUR (IV SOLUTION) IMPLANT

## 2020-09-20 NOTE — Anesthesia Preprocedure Evaluation (Addendum)
Anesthesia Evaluation  Patient identified by MRN, date of birth, ID band Patient awake    Reviewed: Allergy & Precautions, NPO status , Patient's Chart, lab work & pertinent test results  Airway Mallampati: I  TM Distance: >3 FB Neck ROM: Full    Dental no notable dental hx.    Pulmonary neg pulmonary ROS,    Pulmonary exam normal breath sounds clear to auscultation       Cardiovascular negative cardio ROS Normal cardiovascular exam Rhythm:Regular Rate:Normal  ECG: NSR, rate 82   Neuro/Psych  Headaches, PSYCHIATRIC DISORDERS Depression    GI/Hepatic negative GI ROS, Neg liver ROS,   Endo/Other  negative endocrine ROS  Renal/GU negative Renal ROS     Musculoskeletal negative musculoskeletal ROS (+)   Abdominal (+) + obese,   Peds  Hematology  (+) anemia ,   Anesthesia Other Findings Abnormal Uterine Bleeding   Reproductive/Obstetrics hcg negative                            Anesthesia Physical Anesthesia Plan  ASA: II  Anesthesia Plan: General   Post-op Pain Management:    Induction: Intravenous  PONV Risk Score and Plan: 4 or greater and Ondansetron, Dexamethasone, Midazolam, Scopolamine patch - Pre-op and Treatment may vary due to age or medical condition  Airway Management Planned: Oral ETT  Additional Equipment:   Intra-op Plan:   Post-operative Plan: Extubation in OR  Informed Consent: I have reviewed the patients History and Physical, chart, labs and discussed the procedure including the risks, benefits and alternatives for the proposed anesthesia with the patient or authorized representative who has indicated his/her understanding and acceptance.     Dental advisory given  Plan Discussed with: CRNA  Anesthesia Plan Comments:        Anesthesia Quick Evaluation

## 2020-09-20 NOTE — Anesthesia Postprocedure Evaluation (Signed)
Anesthesia Post Note  Patient: Erika Fleming  Procedure(s) Performed: XI ROBOTIC ASSISTED LAPAROSCOPIC HYSTERECTOMY AND BILATER SALPINGECTOMY (Bilateral Abdomen)     Patient location during evaluation: PACU Anesthesia Type: General Level of consciousness: awake Pain management: pain level controlled Vital Signs Assessment: post-procedure vital signs reviewed and stable Respiratory status: spontaneous breathing, nonlabored ventilation, respiratory function stable and patient connected to nasal cannula oxygen Cardiovascular status: blood pressure returned to baseline and stable Postop Assessment: no apparent nausea or vomiting Anesthetic complications: no   No complications documented.  Last Vitals:  Vitals:   09/20/20 1150 09/20/20 1231  BP: (!) 141/81 (!) 144/91  Pulse: 70 74  Resp: 16 20  Temp: 36.4 C 36.4 C  SpO2: 100% 100%    Last Pain:  Vitals:   09/20/20 1150  TempSrc:   PainSc: 3                  Detric Scalisi P Khyre Germond

## 2020-09-20 NOTE — Anesthesia Procedure Notes (Signed)
Procedure Name: Intubation Date/Time: 09/20/2020 8:27 AM Performed by: Bonney Aid, CRNA Pre-anesthesia Checklist: Patient identified, Emergency Drugs available, Suction available and Patient being monitored Patient Re-evaluated:Patient Re-evaluated prior to induction Oxygen Delivery Method: Circle system utilized Preoxygenation: Pre-oxygenation with 100% oxygen Induction Type: IV induction Ventilation: Mask ventilation without difficulty Laryngoscope Size: Mac and 3 Grade View: Grade I Tube type: Oral Tube size: 7.0 mm Number of attempts: 1 Airway Equipment and Method: Stylet Placement Confirmation: ETT inserted through vocal cords under direct vision,  positive ETCO2 and breath sounds checked- equal and bilateral Secured at: 21 cm Tube secured with: Tape Dental Injury: Teeth and Oropharynx as per pre-operative assessment

## 2020-09-20 NOTE — Op Note (Signed)
Pre Op Dx:   1. Abnormal uterine bleeding 2. Uterine fibroids 3. Chronic blood loss anemia Post Op Dx:   Same as pre-op  Procedure:  Robotic Assisted Total Laparoscopic Hysterectomy with Bilateral Salpingectomy   Surgeon:  Dr. Drema Dallas Assistants:  Dr. Christophe Louis Anesthesia:  General   EBL:  50cc  IVF:  38cc UOP:  150   Drains:  Foley catheter - removed at conclusion of case Specimen removed:  Uterus, cervix, bilateral fallopian tubes - all sent to pathology Device(s) implanted: None Case Type:  Clean-contaminated Findings:  Uterus tubular in shape with a ~4cm fundal fibroid. Normal-appearing bilateral fallopian tubes and ovaries. Normal liver contours and gallbladder. Complications: None Indications:  35 y.o. G1P0010 with abnormal uterine bleeding, uterine fibroids, and chronic blood loss anemia who desired definitive surgical management and does not desire future fertility. Description of each procedure:  After informed consent the patient was taken to the operating room and placed in dorsal supine position where general endotracheal anesthesia was administered and found to be adequate.  She was placed in dorsal lithotomy position with her arms tucked.  She was prepped and draped in the usual sterile fashion. A RUMI uterine manipulator with the Koh cup and a Foley catheter were placed.  A timeout was called and the procedure confirmed.    A 47mm supraumbilical incision was made and a trochar was used to enter the abdomen under direct visualization.  Pneumoperitoneum was established and atraumatic entry confirmed. Three additional 49mm ports were placed on either side of the umbilicus and an 28mm port was placed in the left upper quadrant under direct visualization. All port sites were injected with 10cc local anesthetic. The pelvis was bathed in a 60cc Ropivicaine solution.  The patient was placed in Trendelenburg position and the [employer] robotic device was docked.  Next, attention  was turned to the console where the hysterectomy was performed.  The right fallopian tube was divided at the mesosalpinx. The uteroovarian anastamosis was divided and the right round ligament was divided.  This process was repeated on the contralateral side.  The anterior leaflet of the peritoneum was divided to create a bladder flap. The uterine artery and vein were skeletonized and desiccated superior to the Koh cup.  This process was repeated on the contralateral side.  Uterine blanching was observed.  A circumferential colpotomy was created along the ridge of the Koh cup and the uterus was passed off the field.  The vaginal occluder was placed in the vagina to maintain pneumoperitoneum.  The vaginal cuff was then closed with V-loc suture. Hemostasis confirmed. Arista powder was placed on the vaginal cuff and adnexa bilaterally.  The [employer] Vinc[employer] robotic device was undocked and all ports were visualized.   The pneumoperitoneum was reduced completely with the assistance of two deep breaths and all ports were removed.  The skin was closed with 4-0 Vicryl in subcuticular fashion with skin glue placed atop each port site.   The vagina was inspected and there were no vaginal tears noted at the introitus and no foreign objects remaining in the vagina. Foley catheter was removed. The patient was returned to dorsal supine position, awakened and extubated in the OR having appeared to tolerate the procedure well.  All sponge, needle, and instrument counts were correct x 2 at the end of the case.  Disposition:  PACU  Drema Dallas, DO

## 2020-09-20 NOTE — Interval H&P Note (Signed)
History and Physical Interval Note:  09/20/2020 8:00 AM  Erika Fleming  has presented today for surgery, with the diagnosis of Abnormal Uterine Bleeding (N93.9).  The various methods of treatment have been discussed with the patient and family. After consideration of risks, benefits and other options for treatment, the patient has consented to  Procedure(s): XI ROBOTIC ASSISTED LAPAROSCOPIC HYSTERECTOMY AND BILATER SALPINGECTOMY (Bilateral) as a surgical intervention.  The patient's history has been reviewed, patient examined, no change in status, stable for surgery.  I have reviewed the patient's chart and labs.  Questions were answered to the patient's satisfaction.     Drema Dallas

## 2020-09-20 NOTE — Transfer of Care (Signed)
Immediate Anesthesia Transfer of Care Note  Patient: Erika Fleming  Procedure(s) Performed: XI ROBOTIC ASSISTED LAPAROSCOPIC HYSTERECTOMY AND BILATER SALPINGECTOMY (Bilateral Abdomen)  Patient Location: PACU  Anesthesia Type:General  Level of Consciousness: drowsy  Airway & Oxygen Therapy: Patient Spontanous Breathing and Patient connected to nasal cannula oxygen  Post-op Assessment: Report given to RN  Post vital signs: Reviewed and stable  Last Vitals:  Vitals Value Taken Time  BP 111/73 09/20/20 1046  Temp    Pulse 64 09/20/20 1049  Resp 16 09/20/20 1049  SpO2 100 % 09/20/20 1049  Vitals shown include unvalidated device data.  Last Pain:  Vitals:   09/20/20 0715  TempSrc: Oral  PainSc: 0-No pain      Patients Stated Pain Goal: 3 (04/10/27 1188)  Complications: No complications documented.

## 2020-09-21 ENCOUNTER — Encounter (HOSPITAL_BASED_OUTPATIENT_CLINIC_OR_DEPARTMENT_OTHER): Payer: Self-pay | Admitting: Obstetrics and Gynecology

## 2020-09-21 DIAGNOSIS — N939 Abnormal uterine and vaginal bleeding, unspecified: Secondary | ICD-10-CM | POA: Diagnosis not present

## 2020-09-21 LAB — SURGICAL PATHOLOGY

## 2020-09-21 MED ORDER — IBUPROFEN 800 MG PO TABS
ORAL_TABLET | ORAL | Status: AC
  Filled 2020-09-21: qty 1

## 2020-09-21 MED ORDER — OXYCODONE HCL 5 MG PO TABS
ORAL_TABLET | ORAL | Status: AC
  Filled 2020-09-21: qty 2

## 2020-09-21 MED ORDER — ACETAMINOPHEN 500 MG PO TABS: 1000.0000 mg | ORAL_TABLET | Freq: Four times a day (QID) | ORAL | 3 refills | Status: DC | PRN

## 2020-09-21 MED ORDER — OXYCODONE HCL 5 MG PO TABS: 5.0000 mg | ORAL_TABLET | ORAL | 0 refills | Status: DC | PRN

## 2020-09-21 MED ORDER — ACETAMINOPHEN 500 MG PO TABS
ORAL_TABLET | ORAL | Status: AC
  Filled 2020-09-21: qty 2

## 2020-09-21 MED ORDER — IBUPROFEN 800 MG PO TABS
800.0000 mg | ORAL_TABLET | Freq: Three times a day (TID) | ORAL | 3 refills | Status: DC
Start: 2020-09-21 — End: 2020-11-08

## 2020-09-21 NOTE — Discharge Summary (Signed)
Physician Discharge Summary  Patient ID: Erika Fleming MRN: [id number] DOB/AGE: Nov 25, 1985 35 y.o.  Admit date: 09/20/2020 Discharge date: 09/21/2020  Admission Diagnoses: Abnormal uterine bleeding Chronic blood loss anemia Uterine fibroids  Discharge Diagnoses:  Active Problems:   Abnormal uterine bleeding (AUB)   Chronic blood loss anemia Uterine fibroids  Procedure(s): Robotic Assisted Total Laparoscopic Hysterectomy with Bilateral Salpingectomy  Discharged Condition: good  Hospital Course: Patient was admitted on 09/20/2020 for the above named procedure for the above diagnoses. She had a normal post-operative course. On the day of discharge, her pain was well-controlled, tolerating a regular diet, she was ambulating, voiding spontaneously, and passing flatus. She was discharged home in stable condition.  Consults: None  Significant Diagnostic Studies: None  Treatments: surgery: See above  Discharge Exam: Blood pressure 107/66, pulse 70, temperature 98.6 F (37 C), resp. rate 16, height 5\' 4"  (1.626 m), weight 86.2 kg, last menstrual period 09/12/2020, SpO2 98 %. Gen:  NAD, pleasant and cooperative Cardio:  RRR Lungs:  CTAB Abd:  Normoactive bowel sounds, soft, non-distended, non-tender to palpation, laparoscopic port sites with skin glue atop and incisions c/d/i Ext:  No bilateral LE edema or calf tenderness   Disposition: Discharge disposition: 01-Home or Self Care       Discharge Instructions    Discharge patient   Complete by: As directed    Discharge disposition: 01-Home or Self Care   Discharge patient date: 09/21/2020     Allergies as of 09/21/2020   No Known Allergies     Medication List    STOP taking these medications   norgestimate-ethinyl estradiol 0.25-35 MG-MCG tablet Commonly known as: ORTHO-CYCLEN     TAKE these medications   acetaminophen 500 MG tablet Commonly known as: TYLENOL Take 2 tablets (1,000 mg total) by mouth every 6 (six)  hours as needed.   citalopram 10 MG tablet Commonly known as: CELEXA Take 10 mg by mouth daily.   ibuprofen 800 MG tablet Commonly known as: ADVIL Take 1 tablet (800 mg total) by mouth every 8 (eight) hours.   Iron 325 (65 Fe) MG Tabs Take by mouth in the morning and at bedtime.   loratadine 10 MG tablet Commonly known as: CLARITIN Take 10 mg by mouth daily.   OVER THE COUNTER MEDICATION Eye drop prn   oxyCODONE 5 MG immediate release tablet Commonly known as: Oxy IR/ROXICODONE Take 1-2 tablets (5-10 mg total) by mouth every 4 (four) hours as needed for moderate pain.       Follow-up Information    Drema Dallas, DO Follow up in 3 week(s).   Specialty: Obstetrics and Gynecology Why: Please keep your 3 week post-operative visit. Contact information: [address] [place] [address] [phone]               Signed: Drema Dallas 09/21/2020, 6:45 AM

## 2020-09-21 NOTE — Discharge Instructions (Signed)
Total Laparoscopic Hysterectomy, Care After The following information offers guidance on how to care for yourself after your procedure. Your health care provider may also give you more specific instructions. If you have problems or questions, contact your health care provider. What can I expect after the procedure? After the procedure, it is common to have:  Pain, bruising, and numbness around your incisions.  Tiredness (fatigue).  Poor appetite.  Less interest in sex.  Vaginal discharge or bleeding. You will need to use a sanitary pad after this procedure.  Feelings of sadness or other emotions. If your ovaries were also removed, it is also common to have symptoms of menopause, such as hot flashes, night sweats, and lack of sleep (insomnia). Follow these instructions at home: Medicines  Take over-the-counter and prescription medicines only as told by your health care provider.  Ask your health care provider if the medicine prescribed to you: ? Requires you to avoid driving or using machinery. ? Can cause constipation. You may need to take these actions to prevent or treat constipation:  Drink enough fluid to keep your urine pale yellow.  Take over-the-counter or prescription medicines.  Eat foods that are high in fiber, such as beans, whole grains, and fresh fruits and vegetables.  Limit foods that are high in fat and processed sugars, such as fried or sweet foods. Incision care  Follow instructions from your health care provider about how to take care of your incisions. Make sure you: ? Wash your hands with soap and water for at least 20 seconds before and after you change your bandage (dressing). If soap and water are not available, use hand sanitizer. ? Change your dressing as told by your health care provider. ? Leave stitches (sutures), skin glue, or adhesive strips in place. These skin closures may need to stay in place for 2 weeks or longer. If adhesive strip edges start  to loosen and curl up, you may trim the loose edges. Do not remove adhesive strips completely unless your health care provider tells you to do that.  Check your incision areas every day for signs of infection. Check for: ? More redness, swelling, or pain. ? Fluid or blood. ? Warmth. ? Pus or a bad smell.   Activity  Rest as told by your health care provider.  Avoid sitting for a long time without moving. Get up to take short walks every 1-2 hours. This is important to improve blood flow and breathing. Ask for help if you feel weak or unsteady.  Return to your normal activities as told by your health care provider. Ask your health care provider what activities are safe for you.  Do not lift anything that is heavier than 10 lb (4.5 kg), or the limit that you are told, for one month after surgery or until your health care provider says that it is safe.  If you were given a sedative during the procedure, it can affect you for several hours. Do not drive or operate machinery until your health care provider says that it is safe.   Lifestyle  Do not use any products that contain nicotine or tobacco. These products include cigarettes, chewing tobacco, and vaping devices, such as e-cigarettes. These can delay healing after surgery. If you need help quitting, ask your health care provider.  Do not drink alcohol until your health care provider approves. General instructions  Do not douche, use tampons, or have sex for at least 6 weeks, or as told by your health   care provider.  If you struggle with physical or emotional changes after your procedure, speak with your health care provider or a therapist.  Do not take baths, swim, or use a hot tub until your health care provider approves. You may only be allowed to take showers for 2-3 weeks.  Keep your dressing dry until your health care provider says it can be removed.  Try to have someone at home with you for the first 1-2 weeks to help with your  daily chores.  Wear compression stockings as told by your health care provider. These stockings help to prevent blood clots and reduce swelling in your legs.  Keep all follow-up visits. This is important.   Contact a health care provider if:  You have any of these signs of infection: ? Chills or a fever. ? More redness, swelling, or pain around an incision. ? Fluid or blood coming from an incision. ? Warmth coming from an incision. ? Pus or a bad smell coming from an incision.  An incision opens.  You feel dizzy or light-headed.  You have pain or bleeding when you urinate, or you are unable to urinate.  You have abnormal vaginal discharge.  You have pain that does not get better with medicine. Get help right away if:  You have a fever and your symptoms suddenly get worse.  You have severe abdominal pain.  You have chest pain or shortness of breath.  You faint.  You have pain, swelling, or redness in your leg.  You have heavy vaginal bleeding with blood clots, soaking through a sanitary pad in less than 1 hour. These symptoms may represent a serious problem that is an emergency. Do not wait to see if the symptoms will go away. Get medical help right away. Call your local emergency services (911 in the [place] Do not drive yourself to the hospital. Summary  After the procedure, it is common to have pain and bruising around your incisions.  Do not take baths, swim, or use a hot tub until your health care provider approves.  Do not lift anything that is heavier than 10 lb (4.5 kg), or the limit that you are told, for one month after surgery or until your health care provider says that it is safe.  Tell your health care provider if you have any signs or symptoms of infection after the procedure.  Get help right away if you have severe abdominal pain, chest pain, shortness of breath, or heavy bleeding from your vagina. This information is not intended to replace advice given  to you by your health care provider. Make sure you discuss any questions you have with your health care provider. Document Revised: 01/28/2020 Document Reviewed: 01/28/2020 Elsevier Patient Education  2021 [employer]  

## 2020-10-20 ENCOUNTER — Other Ambulatory Visit: Payer: Self-pay | Admitting: Family Medicine

## 2020-10-20 DIAGNOSIS — R9389 Abnormal findings on diagnostic imaging of other specified body structures: Secondary | ICD-10-CM

## 2020-10-27 ENCOUNTER — Ambulatory Visit
Admission: RE | Admit: 2020-10-27 | Discharge: 2020-10-27 | Disposition: A | Discharge status: Home / Self Care | Payer: Commercial Managed Care - PPO | Source: Ambulatory Visit | Attending: Family Medicine | Admitting: Family Medicine

## 2020-10-27 ENCOUNTER — Other Ambulatory Visit: Payer: Self-pay

## 2020-10-27 DIAGNOSIS — R9389 Abnormal findings on diagnostic imaging of other specified body structures: Secondary | ICD-10-CM

## 2020-10-27 MED ORDER — IOPAMIDOL (ISOVUE-300) INJECTION 61%
75.0000 mL | Freq: Once | INTRAVENOUS | Status: AC | PRN
  Administered 2020-10-27: 75 mL via INTRAVENOUS

## 2020-10-30 ENCOUNTER — Other Ambulatory Visit: Payer: Self-pay | Admitting: Family Medicine

## 2020-10-30 DIAGNOSIS — R9389 Abnormal findings on diagnostic imaging of other specified body structures: Secondary | ICD-10-CM

## 2020-11-08 ENCOUNTER — Other Ambulatory Visit: Payer: Self-pay

## 2020-11-08 ENCOUNTER — Encounter: Payer: Self-pay | Admitting: Plastic Surgery

## 2020-11-08 ENCOUNTER — Ambulatory Visit (INDEPENDENT_AMBULATORY_CARE_PROVIDER_SITE_OTHER): Payer: Commercial Managed Care - PPO | Admitting: Plastic Surgery

## 2020-11-08 VITALS — BP 118/79 | HR 97 | Ht 64.0 in | Wt 194.2 lb

## 2020-11-08 DIAGNOSIS — N62 Hypertrophy of breast: Secondary | ICD-10-CM | POA: Diagnosis not present

## 2020-11-08 DIAGNOSIS — M546 Pain in thoracic spine: Secondary | ICD-10-CM | POA: Diagnosis not present

## 2020-11-08 DIAGNOSIS — M545 Low back pain, unspecified: Secondary | ICD-10-CM | POA: Diagnosis not present

## 2020-11-08 DIAGNOSIS — M4004 Postural kyphosis, thoracic region: Secondary | ICD-10-CM

## 2020-11-08 NOTE — Progress Notes (Addendum)
Referring Provider Maurice Small, MD [facility] [address],  [place] [address]   CC:  Chief Complaint  Patient presents with   Advice Only      Erika Fleming is an 35 y.o. female.  HPI: Patient presents to discuss breast reduction.  She has had years of back pain, neck pain and shoulder grooving related to her large breasts.  She tried over-the-counter medications, warm packs, cold packs and supportive bras with little relief.  She also gets rashes beneath her breast that been refractory to over-the-counter treatments.  She is currently 70 double D and would be happy to be around a C cup.  She does have a family history of breast cancer with her maternal grandmother.  She herself has not had any previous breast procedures or biopsies.  She does not smoke and is not a diabetic.  No Known Allergies  Outpatient Encounter Medications as of 11/08/2020  Medication Sig   Ferrous Sulfate (IRON) 325 (65 Fe) MG TABS Take by mouth in the morning and at bedtime.   loratadine (CLARITIN) 10 MG tablet Take 10 mg by mouth daily.   OVER THE COUNTER MEDICATION Eye drop prn   [DISCONTINUED] acetaminophen (TYLENOL) 500 MG tablet Take 2 tablets (1,000 mg total) by mouth every 6 (six) hours as needed.   [DISCONTINUED] citalopram (CELEXA) 10 MG tablet Take 10 mg by mouth daily.   [DISCONTINUED] ibuprofen (ADVIL) 800 MG tablet Take 1 tablet (800 mg total) by mouth every 8 (eight) hours.   [DISCONTINUED] oxyCODONE (OXY IR/ROXICODONE) 5 MG immediate release tablet Take 1-2 tablets (5-10 mg total) by mouth every 4 (four) hours as needed for moderate pain.   No facility-administered encounter medications on file as of 11/08/2020.     Past Medical History:  Diagnosis Date   Abnormal uterine bleeding (AUB)    Anemia    Depression    Headache    Wears glasses     Past Surgical History:  Procedure Laterality Date   NO PAST SURGERIES     ROBOTIC ASSISTED LAPAROSCOPIC HYSTERECTOMY AND SALPINGECTOMY  Bilateral 09/20/2020   Procedure: XI ROBOTIC ASSISTED LAPAROSCOPIC HYSTERECTOMY AND BILATER SALPINGECTOMY;  Surgeon: Drema Dallas, DO;  Location: [facility];  Service: Gynecology;  Laterality: Bilateral;    No family history on file.  Social History   Social History Narrative   Not on file     Review of Systems General: Denies fevers, chills, weight loss CV: Denies chest pain, shortness of breath, palpitations  Physical Exam Vitals with BMI 11/08/2020 09/21/2020 09/21/2020  Height 5\' 4"  - -  Weight 194 lbs 3 oz - -  BMI 45.80 - -  Systolic 998 338 250  Diastolic 79 63 66  Pulse 97 78 70    General:  No acute distress,  Alert and oriented, Non-Toxic, Normal speech and affect Breast: She has grade 2 ptosis.  Sternal notch to nipple is 31 cm on the right and 30 cm on the left.  Nipple to fold is 13 cm on the right and 14 cm on the left.  I do not see any obvious scars or masses.  I think she may be a little larger on the right side.  Assessment/Plan The patient has bilateral symptomatic macromastia.  She is a good candidate for a breast reduction.  She is interested in pursuing surgical treatment.  She has tried supportive garments and fitted bras with no relief.  The details of breast reduction surgery were  discussed.  I explained the procedure in detail along the with the expected scars.  The risks were discussed in detail and include bleeding, infection, damage to surrounding structures, need for additional procedures, nipple loss, change in nipple sensation, persistent pain, contour irregularities and asymmetries.  I explained that breast feeding is often not possible after breast reduction surgery.  We discussed the expected postoperative course with an overall recovery period of about 1 month.  She demonstrated full understanding of all risks.  We discussed her personal risk factors.  The patient is interested in pursuing surgical treatment.  I anticipate approximately  625g of tissue removed from each side.   [name] 11/08/2020, 11:11 AM

## 2021-03-08 ENCOUNTER — Encounter: Payer: Self-pay | Admitting: Surgical

## 2021-03-08 ENCOUNTER — Other Ambulatory Visit: Payer: Self-pay

## 2021-03-08 ENCOUNTER — Ambulatory Visit (INDEPENDENT_AMBULATORY_CARE_PROVIDER_SITE_OTHER): Payer: Commercial Managed Care - PPO | Admitting: Surgical

## 2021-03-08 VITALS — BP 120/76 | HR 91 | Ht 64.0 in | Wt 191.2 lb

## 2021-03-08 DIAGNOSIS — N62 Hypertrophy of breast: Secondary | ICD-10-CM

## 2021-03-08 DIAGNOSIS — M546 Pain in thoracic spine: Secondary | ICD-10-CM

## 2021-03-08 DIAGNOSIS — M4004 Postural kyphosis, thoracic region: Secondary | ICD-10-CM

## 2021-03-08 DIAGNOSIS — M545 Low back pain, unspecified: Secondary | ICD-10-CM

## 2021-03-08 MED ORDER — OXYCODONE-ACETAMINOPHEN 5-325 MG PO TABS: 1.0000 | ORAL_TABLET | Freq: Four times a day (QID) | ORAL | 0 refills | Status: AC | PRN

## 2021-03-08 MED ORDER — ONDANSETRON HCL 4 MG PO TABS: 4.0000 mg | ORAL_TABLET | Freq: Three times a day (TID) | ORAL | 0 refills | Status: AC | PRN

## 2021-03-08 NOTE — H&P (View-Only) (Signed)
Patient ID: Erika Fleming, female    DOB: Nov 03, 1985, 35 y.o.   MRN: [id number]  Chief Complaint  Patient presents with   Pre-op Exam      ICD-10-CM   1. Macromastia  N62     2. Back pain of thoracolumbar region  M54.50    M54.6     3. Postural kyphosis, thoracic region  M40.04       History of Present Illness: Erika Fleming is a 35 y.o.  female  with a history of macromastia.  She presents for preoperative evaluation for upcoming procedure, Bilateral Breast Reduction, scheduled for 03/20/21 with Dr.  Claudia Desanctis  The patient has not had problems with anesthesia. No history of DVT/PE.  No family history of DVT/PE.  No family or personal history of bleeding or clotting disorders.  Patient is not currently taking any blood thinners.  No history of CVA/MI.   Summary of Previous Visit: currently 15 DD, would like to be around a C cup. Not a smoker. Not a diabetic.  Estimated excess breast tissue to be removed at time of surgery: 625 on the left and 625 on the right.  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  Fax number for her job is [phone].  PMH Significant for: No significant past medical history  Patient most recently underwent hysterectomy on 09/20/2020.  Reports she did well with this.  No anesthesia complications or issues.  She is not having any infectious symptoms.  No recent changes in her health.  Past Medical History: Allergies: No Known Allergies  Current Medications:  Current Outpatient Medications:    Ferrous Sulfate (IRON) 325 (65 Fe) MG TABS, Take by mouth in the morning and at bedtime., Disp: , Rfl:    fluticasone (FLONASE) 50 MCG/ACT nasal spray, Place 2 sprays into both nostrils daily., Disp: , Rfl:    loratadine (CLARITIN) 10 MG tablet, Take 10 mg by mouth daily., Disp: , Rfl:    OVER THE COUNTER MEDICATION, Eye drop prn, Disp: , Rfl:   Past Medical Problems: Past Medical History:  Diagnosis Date   Abnormal uterine  bleeding (AUB)    Anemia    Depression    Headache    Wears glasses     Past Surgical History: Past Surgical History:  Procedure Laterality Date   NO PAST SURGERIES     ROBOTIC ASSISTED LAPAROSCOPIC HYSTERECTOMY AND SALPINGECTOMY Bilateral 09/20/2020   Procedure: XI ROBOTIC ASSISTED LAPAROSCOPIC HYSTERECTOMY AND BILATER SALPINGECTOMY;  Surgeon: Drema Dallas, DO;  Location: [facility];  Service: Gynecology;  Laterality: Bilateral;    Social History: Social History   Socioeconomic History   Marital status: Unknown    Spouse name: Not on file   Number of children: Not on file   Years of education: Not on file   Highest education level: Not on file  Occupational History   Not on file  Tobacco Use   Smoking status: Former    Types: Cigarettes   Smokeless tobacco: Never  Vaping Use   Vaping Use: Never used  Substance and Sexual Activity   Alcohol use: Never   Drug use: Never   Sexual activity: Not on file  Other Topics Concern   Not on file  Social History Narrative   Not on file   Social Determinants of Health   Financial Resource Strain: Not on file  Food Insecurity: Not on file  Transportation Needs: Not on file  Physical  Activity: Not on file  Stress: Not on file  Social Connections: Not on file  Intimate Partner Violence: Not on file    Family History: No family history on file.  Review of Systems: Review of Systems  Constitutional: Negative.   Respiratory: Negative.    Cardiovascular: Negative.   Gastrointestinal: Negative.   Neurological: Negative.    Physical Exam: Vital Signs BP 120/76 (BP Location: Left Arm, Patient Position: Sitting, Cuff Size: Large)   Pulse 91   Ht 5\' 4"  (1.626 m)   Wt 191 lb 3.2 oz (86.7 kg)   SpO2 100%   BMI 32.82 kg/m   Physical Exam  Constitutional:      General: Not in acute distress.    Appearance: Normal appearance. Not ill-appearing.  HENT:     Head: Normocephalic and atraumatic.  Eyes:      Pupils: Pupils are equal, round Neck:     Musculoskeletal: Normal range of motion.  Cardiovascular:     Rate and Rhythm: Normal rate    Pulses: Normal pulses.  Pulmonary:     Effort: Pulmonary effort is normal. No respiratory distress.  Musculoskeletal: Normal range of motion.  Skin:    General: Skin is warm and dry.     Findings: No erythema or rash.  Neurological:     General: No focal deficit present.     Mental Status: Alert and oriented to person, place, and time. Mental status is at baseline.     Motor: No weakness.  Psychiatric:        Mood and Affect: Mood normal.        Behavior: Behavior normal.    Assessment/Plan: The patient is scheduled for bilateral breast reduction with Dr. Claudia Desanctis.  Risks, benefits, and alternatives of procedure discussed, questions answered and consent obtained.    Smoking Status: Non-smoker; Counseling Given?  N/A Last Mammogram: No mammographic history; Results: N/A  Caprini Score: 3, moderate; Risk Factors include: BMI >25, and length of planned surgery. Recommendation for mechanical  pharmacological prophylaxis. Encourage early ambulation.   Pictures obtained: @consult   Post-op Rx sent to pharmacy:  Percocet, Zofran  Patient was provided with the breast reduction and General Surgical Risk consent document and Pain Medication Agreement prior to their appointment.  They had adequate time to read through the risk consent documents and Pain Medication Agreement. We also discussed them in person together during this preop appointment. All of their questions were answered to their satisfaction.  Recommended calling if they have any further questions.  Risk consent form and Pain Medication Agreement to be scanned into patient's chart.  The risk that can be encountered with breast reduction were discussed and include the following but not limited to these:  Breast asymmetry, fluid accumulation, firmness of the breast, inability to breast feed, loss of  nipple or areola, skin loss, decrease or no nipple sensation, fat necrosis of the breast tissue, bleeding, infection, healing delay.  There are risks of anesthesia, changes to skin sensation and injury to nerves or blood vessels.  The muscle can be temporarily or permanently injured.  You may have an allergic reaction to tape, suture, glue, blood products which can result in skin discoloration, swelling, pain, skin lesions, poor healing.  Any of these can lead to the need for revisonal surgery or stage procedures.  A reduction has potential to interfere with diagnostic procedures.  Nipple or breast piercing can increase risks of infection.  This procedure is best done when the breast is fully developed.  Changes in the breast will continue to occur over time.  Pregnancy can alter the outcomes of previous breast reduction surgery, weight gain and weigh loss can also effect the long term appearance.    Electronically signed by: Carola Rhine Conn Trombetta, PA-C 03/08/2021 8:31 AM

## 2021-03-08 NOTE — Progress Notes (Signed)
Patient ID: Erika Fleming, female    DOB: 06/02/1986, 35 y.o.   MRN: [id number]  Chief Complaint  Patient presents with   Pre-op Exam      ICD-10-CM   1. Macromastia  N62     2. Back pain of thoracolumbar region  M54.50    M54.6     3. Postural kyphosis, thoracic region  M40.04       History of Present Illness: Erika Fleming is a 34 y.o.  female  with a history of macromastia.  She presents for preoperative evaluation for upcoming procedure, Bilateral Breast Reduction, scheduled for 03/20/21 with Dr.  Claudia Desanctis  The patient has not had problems with anesthesia. No history of DVT/PE.  No family history of DVT/PE.  No family or personal history of bleeding or clotting disorders.  Patient is not currently taking any blood thinners.  No history of CVA/MI.   Summary of Previous Visit: currently 41 DD, would like to be around a C cup. Not a smoker. Not a diabetic.  Estimated excess breast tissue to be removed at time of surgery: 625 on the left and 625 on the right.  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  Fax number for her job is [phone].  PMH Significant for: No significant past medical history  Patient most recently underwent hysterectomy on 09/20/2020.  Reports she did well with this.  No anesthesia complications or issues.  She is not having any infectious symptoms.  No recent changes in her health.  Past Medical History: Allergies: No Known Allergies  Current Medications:  Current Outpatient Medications:    Ferrous Sulfate (IRON) 325 (65 Fe) MG TABS, Take by mouth in the morning and at bedtime., Disp: , Rfl:    fluticasone (FLONASE) 50 MCG/ACT nasal spray, Place 2 sprays into both nostrils daily., Disp: , Rfl:    loratadine (CLARITIN) 10 MG tablet, Take 10 mg by mouth daily., Disp: , Rfl:    OVER THE COUNTER MEDICATION, Eye drop prn, Disp: , Rfl:   Past Medical Problems: Past Medical History:  Diagnosis Date   Abnormal uterine  bleeding (AUB)    Anemia    Depression    Headache    Wears glasses     Past Surgical History: Past Surgical History:  Procedure Laterality Date   NO PAST SURGERIES     ROBOTIC ASSISTED LAPAROSCOPIC HYSTERECTOMY AND SALPINGECTOMY Bilateral 09/20/2020   Procedure: XI ROBOTIC ASSISTED LAPAROSCOPIC HYSTERECTOMY AND BILATER SALPINGECTOMY;  Surgeon: Drema Dallas, DO;  Location: [facility];  Service: Gynecology;  Laterality: Bilateral;    Social History: Social History   Socioeconomic History   Marital status: Unknown    Spouse name: Not on file   Number of children: Not on file   Years of education: Not on file   Highest education level: Not on file  Occupational History   Not on file  Tobacco Use   Smoking status: Former    Types: Cigarettes   Smokeless tobacco: Never  Vaping Use   Vaping Use: Never used  Substance and Sexual Activity   Alcohol use: Never   Drug use: Never   Sexual activity: Not on file  Other Topics Concern   Not on file  Social History Narrative   Not on file   Social Determinants of Health   Financial Resource Strain: Not on file  Food Insecurity: Not on file  Transportation Needs: Not on file  Physical  Activity: Not on file  Stress: Not on file  Social Connections: Not on file  Intimate Partner Violence: Not on file    Family History: No family history on file.  Review of Systems: Review of Systems  Constitutional: Negative.   Respiratory: Negative.    Cardiovascular: Negative.   Gastrointestinal: Negative.   Neurological: Negative.    Physical Exam: Vital Signs BP 120/76 (BP Location: Left Arm, Patient Position: Sitting, Cuff Size: Large)   Pulse 91   Ht 5\' 4"  (1.626 m)   Wt 191 lb 3.2 oz (86.7 kg)   SpO2 100%   BMI 32.82 kg/m   Physical Exam  Constitutional:      General: Not in acute distress.    Appearance: Normal appearance. Not ill-appearing.  HENT:     Head: Normocephalic and atraumatic.  Eyes:      Pupils: Pupils are equal, round Neck:     Musculoskeletal: Normal range of motion.  Cardiovascular:     Rate and Rhythm: Normal rate    Pulses: Normal pulses.  Pulmonary:     Effort: Pulmonary effort is normal. No respiratory distress.  Musculoskeletal: Normal range of motion.  Skin:    General: Skin is warm and dry.     Findings: No erythema or rash.  Neurological:     General: No focal deficit present.     Mental Status: Alert and oriented to person, place, and time. Mental status is at baseline.     Motor: No weakness.  Psychiatric:        Mood and Affect: Mood normal.        Behavior: Behavior normal.    Assessment/Plan: The patient is scheduled for bilateral breast reduction with Dr. Claudia Desanctis.  Risks, benefits, and alternatives of procedure discussed, questions answered and consent obtained.    Smoking Status: Non-smoker; Counseling Given?  N/A Last Mammogram: No mammographic history; Results: N/A  Caprini Score: 3, moderate; Risk Factors include: BMI >25, and length of planned surgery. Recommendation for mechanical  pharmacological prophylaxis. Encourage early ambulation.   Pictures obtained: @consult   Post-op Rx sent to pharmacy:  Percocet, Zofran  Patient was provided with the breast reduction and General Surgical Risk consent document and Pain Medication Agreement prior to their appointment.  They had adequate time to read through the risk consent documents and Pain Medication Agreement. We also discussed them in person together during this preop appointment. All of their questions were answered to their satisfaction.  Recommended calling if they have any further questions.  Risk consent form and Pain Medication Agreement to be scanned into patient's chart.  The risk that can be encountered with breast reduction were discussed and include the following but not limited to these:  Breast asymmetry, fluid accumulation, firmness of the breast, inability to breast feed, loss of  nipple or areola, skin loss, decrease or no nipple sensation, fat necrosis of the breast tissue, bleeding, infection, healing delay.  There are risks of anesthesia, changes to skin sensation and injury to nerves or blood vessels.  The muscle can be temporarily or permanently injured.  You may have an allergic reaction to tape, suture, glue, blood products which can result in skin discoloration, swelling, pain, skin lesions, poor healing.  Any of these can lead to the need for revisonal surgery or stage procedures.  A reduction has potential to interfere with diagnostic procedures.  Nipple or breast piercing can increase risks of infection.  This procedure is best done when the breast is fully developed.  Changes in the breast will continue to occur over time.  Pregnancy can alter the outcomes of previous breast reduction surgery, weight gain and weigh loss can also effect the long term appearance.    Electronically signed by: Carola Rhine Brooksie Ellwanger, PA-C 03/08/2021 8:31 AM

## 2021-03-12 ENCOUNTER — Encounter (HOSPITAL_BASED_OUTPATIENT_CLINIC_OR_DEPARTMENT_OTHER): Payer: Self-pay | Admitting: Plastic Surgery

## 2021-03-12 ENCOUNTER — Other Ambulatory Visit: Payer: Self-pay

## 2021-03-20 ENCOUNTER — Encounter (HOSPITAL_BASED_OUTPATIENT_CLINIC_OR_DEPARTMENT_OTHER): Payer: Self-pay | Admitting: Plastic Surgery

## 2021-03-20 ENCOUNTER — Ambulatory Visit (HOSPITAL_BASED_OUTPATIENT_CLINIC_OR_DEPARTMENT_OTHER): Payer: Commercial Managed Care - PPO | Admitting: Certified Registered"

## 2021-03-20 ENCOUNTER — Other Ambulatory Visit: Payer: Self-pay

## 2021-03-20 ENCOUNTER — Encounter (HOSPITAL_BASED_OUTPATIENT_CLINIC_OR_DEPARTMENT_OTHER)
Admission: RE | Disposition: A | Discharge status: Home / Self Care | Payer: Self-pay | Source: Ambulatory Visit | Attending: Plastic Surgery

## 2021-03-20 ENCOUNTER — Ambulatory Visit (HOSPITAL_BASED_OUTPATIENT_CLINIC_OR_DEPARTMENT_OTHER)
Admission: RE | Admit: 2021-03-20 | Discharge: 2021-03-20 | Disposition: A | Discharge status: Home / Self Care | Payer: Commercial Managed Care - PPO | Source: Ambulatory Visit | Attending: Plastic Surgery | Admitting: Plastic Surgery

## 2021-03-20 DIAGNOSIS — M4004 Postural kyphosis, thoracic region: Secondary | ICD-10-CM

## 2021-03-20 DIAGNOSIS — Z79899 Other long term (current) drug therapy: Secondary | ICD-10-CM | POA: Diagnosis not present

## 2021-03-20 DIAGNOSIS — M546 Pain in thoracic spine: Secondary | ICD-10-CM

## 2021-03-20 DIAGNOSIS — M545 Low back pain, unspecified: Secondary | ICD-10-CM

## 2021-03-20 DIAGNOSIS — N62 Hypertrophy of breast: Secondary | ICD-10-CM

## 2021-03-20 DIAGNOSIS — Z87891 Personal history of nicotine dependence: Secondary | ICD-10-CM | POA: Insufficient documentation

## 2021-03-20 HISTORY — PX: BREAST REDUCTION SURGERY: SHX8

## 2021-03-20 SURGERY — MAMMOPLASTY, REDUCTION
Anesthesia: General | Site: Breast | Laterality: Bilateral

## 2021-03-20 MED ORDER — TRANEXAMIC ACID-NACL 1000-0.7 MG/100ML-% IV SOLN
1000.0000 mg | INTRAVENOUS | Status: AC
  Administered 2021-03-20: 1000 mg via INTRAVENOUS

## 2021-03-20 MED ORDER — PROMETHAZINE HCL 25 MG/ML IJ SOLN: 6.2500 mg | INTRAMUSCULAR | Status: DC | PRN

## 2021-03-20 MED ORDER — PROPOFOL 10 MG/ML IV BOLUS
INTRAVENOUS | Status: DC | PRN
  Administered 2021-03-20: 200 mg via INTRAVENOUS

## 2021-03-20 MED ORDER — PROPOFOL 500 MG/50ML IV EMUL
INTRAVENOUS | Status: DC | PRN
  Administered 2021-03-20: 25 ug/kg/min via INTRAVENOUS

## 2021-03-20 MED ORDER — FENTANYL CITRATE (PF) 100 MCG/2ML IJ SOLN
INTRAMUSCULAR | Status: AC
  Filled 2021-03-20: qty 2

## 2021-03-20 MED ORDER — LACTATED RINGERS IV SOLN: INTRAVENOUS | Status: DC

## 2021-03-20 MED ORDER — MEPERIDINE HCL 25 MG/ML IJ SOLN: 6.2500 mg | INTRAMUSCULAR | Status: DC | PRN

## 2021-03-20 MED ORDER — LIDOCAINE HCL (CARDIAC) PF 100 MG/5ML IV SOSY
PREFILLED_SYRINGE | INTRAVENOUS | Status: DC | PRN
  Administered 2021-03-20: 60 mg via INTRAVENOUS

## 2021-03-20 MED ORDER — ONDANSETRON HCL 4 MG/2ML IJ SOLN
INTRAMUSCULAR | Status: DC | PRN
  Administered 2021-03-20: 4 mg via INTRAVENOUS

## 2021-03-20 MED ORDER — DEXMEDETOMIDINE (PRECEDEX) IN NS 20 MCG/5ML (4 MCG/ML) IV SYRINGE
PREFILLED_SYRINGE | INTRAVENOUS | Status: DC | PRN
  Administered 2021-03-20: 16 ug via INTRAVENOUS

## 2021-03-20 MED ORDER — OXYCODONE HCL 5 MG PO TABS
ORAL_TABLET | ORAL | Status: AC
  Filled 2021-03-20: qty 1

## 2021-03-20 MED ORDER — HYDROMORPHONE HCL 1 MG/ML IJ SOLN
0.2500 mg | INTRAMUSCULAR | Status: DC | PRN
  Administered 2021-03-20: 0.5 mg via INTRAVENOUS

## 2021-03-20 MED ORDER — SCOPOLAMINE 1 MG/3DAYS TD PT72: 1.0000 | MEDICATED_PATCH | TRANSDERMAL | Status: DC

## 2021-03-20 MED ORDER — CEFAZOLIN SODIUM-DEXTROSE 2-4 GM/100ML-% IV SOLN
INTRAVENOUS | Status: AC
  Filled 2021-03-20: qty 100

## 2021-03-20 MED ORDER — MIDAZOLAM HCL 2 MG/2ML IJ SOLN
INTRAMUSCULAR | Status: AC
  Filled 2021-03-20: qty 2

## 2021-03-20 MED ORDER — DEXAMETHASONE SODIUM PHOSPHATE 4 MG/ML IJ SOLN
INTRAMUSCULAR | Status: DC | PRN
  Administered 2021-03-20: 5 mg via INTRAVENOUS

## 2021-03-20 MED ORDER — MIDAZOLAM HCL 5 MG/5ML IJ SOLN
INTRAMUSCULAR | Status: DC | PRN
  Administered 2021-03-20: 2 mg via INTRAVENOUS

## 2021-03-20 MED ORDER — AMISULPRIDE (ANTIEMETIC) 5 MG/2ML IV SOLN: 10.0000 mg | Freq: Once | INTRAVENOUS | Status: DC | PRN

## 2021-03-20 MED ORDER — PHENYLEPHRINE HCL (PRESSORS) 10 MG/ML IV SOLN
INTRAVENOUS | Status: DC | PRN
  Administered 2021-03-20: 40 ug via INTRAVENOUS
  Administered 2021-03-20 (×2): 80 ug via INTRAVENOUS

## 2021-03-20 MED ORDER — OXYCODONE HCL 5 MG PO TABS
5.0000 mg | ORAL_TABLET | Freq: Once | ORAL | Status: AC | PRN
  Administered 2021-03-20: 5 mg via ORAL

## 2021-03-20 MED ORDER — FENTANYL CITRATE (PF) 100 MCG/2ML IJ SOLN
INTRAMUSCULAR | Status: DC | PRN
  Administered 2021-03-20 (×5): 25 ug via INTRAVENOUS
  Administered 2021-03-20: 50 ug via INTRAVENOUS
  Administered 2021-03-20: 25 ug via INTRAVENOUS

## 2021-03-20 MED ORDER — TRANEXAMIC ACID-NACL 1000-0.7 MG/100ML-% IV SOLN
INTRAVENOUS | Status: AC
  Filled 2021-03-20: qty 100

## 2021-03-20 MED ORDER — CEFAZOLIN SODIUM-DEXTROSE 2-4 GM/100ML-% IV SOLN
2.0000 g | INTRAVENOUS | Status: AC
Start: 2021-03-20 — End: 2021-03-20
  Administered 2021-03-20: 2 g via INTRAVENOUS

## 2021-03-20 MED ORDER — HYDROMORPHONE HCL 1 MG/ML IJ SOLN
INTRAMUSCULAR | Status: AC
  Filled 2021-03-20: qty 0.5

## 2021-03-20 MED ORDER — SCOPOLAMINE 1 MG/3DAYS TD PT72
MEDICATED_PATCH | TRANSDERMAL | Status: AC
  Filled 2021-03-20: qty 1

## 2021-03-20 MED ORDER — LACTATED RINGERS IV SOLN
INTRAVENOUS | Status: DC | PRN
  Administered 2021-03-20: 1000 mL

## 2021-03-20 MED ORDER — OXYCODONE HCL 5 MG/5ML PO SOLN: 5.0000 mg | Freq: Once | ORAL | Status: AC | PRN

## 2021-03-20 SURGICAL SUPPLY — 53 items
APL PRP STRL LF DISP 70% ISPRP (MISCELLANEOUS) ×2
APL SKNCLS STERI-STRIP NONHPOA (GAUZE/BANDAGES/DRESSINGS) ×2
BENZOIN TINCTURE PRP APPL 2/3 (GAUZE/BANDAGES/DRESSINGS) ×4 IMPLANT
BLADE SURG 10 STRL SS (BLADE) ×4 IMPLANT
BNDG CMPR MED 10X6 ELC LF (GAUZE/BANDAGES/DRESSINGS) ×1
BNDG ELASTIC 6X10 VLCR STRL LF (GAUZE/BANDAGES/DRESSINGS) ×2 IMPLANT
CANISTER SUCT 1200ML W/VALVE (MISCELLANEOUS) ×2 IMPLANT
CHLORAPREP W/TINT 26 (MISCELLANEOUS) ×4 IMPLANT
COVER BACK TABLE 60X90IN (DRAPES) ×2 IMPLANT
COVER MAYO STAND STRL (DRAPES) ×2 IMPLANT
DRAIN PENROSE 1/2X12 LTX STRL (WOUND CARE) IMPLANT
DRAPE LAPAROSCOPIC ABDOMINAL (DRAPES) ×2 IMPLANT
DRAPE UTILITY XL STRL (DRAPES) ×2 IMPLANT
DRSG PAD ABDOMINAL 8X10 ST (GAUZE/BANDAGES/DRESSINGS) ×8 IMPLANT
ELECT REM PT RETURN 9FT ADLT (ELECTROSURGICAL) ×2
ELECTRODE REM PT RTRN 9FT ADLT (ELECTROSURGICAL) ×1 IMPLANT
GAUZE SPONGE 4X4 12PLY STRL (GAUZE/BANDAGES/DRESSINGS) ×2 IMPLANT
GLOVE SRG 8 PF TXTR STRL LF DI (GLOVE) IMPLANT
GLOVE SURG ENC MOIS LTX SZ7.5 (GLOVE) ×4 IMPLANT
GLOVE SURG ENC TEXT LTX SZ7.5 (GLOVE) ×4 IMPLANT
GLOVE SURG UNDER POLY LF SZ8 (GLOVE)
GOWN STRL REUS W/ TWL LRG LVL3 (GOWN DISPOSABLE) ×3 IMPLANT
GOWN STRL REUS W/TWL LRG LVL3 (GOWN DISPOSABLE) ×6
MARKER SKIN DUAL TIP RULER LAB (MISCELLANEOUS) IMPLANT
NDL SAFETY ECLIPSE 18X1.5 (NEEDLE) IMPLANT
NEEDLE FILTER BLUNT 18X 1/2SAF (NEEDLE) ×1
NEEDLE FILTER BLUNT 18X1 1/2 (NEEDLE) ×1 IMPLANT
NEEDLE HYPO 18GX1.5 SHARP (NEEDLE)
NEEDLE HYPO 25X1 1.5 SAFETY (NEEDLE) IMPLANT
NEEDLE SPNL 18GX3.5 QUINCKE PK (NEEDLE) ×2 IMPLANT
NS IRRIG 1000ML POUR BTL (IV SOLUTION) ×2 IMPLANT
PACK BASIN DAY SURGERY FS (CUSTOM PROCEDURE TRAY) ×2 IMPLANT
PENCIL SMOKE EVACUATOR (MISCELLANEOUS) ×2 IMPLANT
SLEEVE SCD COMPRESS KNEE MED (STOCKING) ×2 IMPLANT
SPONGE T-LAP 18X18 ~~LOC~~+RFID (SPONGE) ×8 IMPLANT
STAPLER INSORB 30 2030 C-SECTI (MISCELLANEOUS) ×2 IMPLANT
STAPLER VISISTAT 35W (STAPLE) ×2 IMPLANT
STRIP SUTURE WOUND CLOSURE 1/2 (MISCELLANEOUS) ×8 IMPLANT
SUT MNCRL AB 4-0 PS2 18 (SUTURE) ×8 IMPLANT
SUT PDS 3-0 CT2 (SUTURE) ×6
SUT PDS II 3-0 CT2 27 ABS (SUTURE) ×3 IMPLANT
SUT VIC AB 3-0 PS1 18 (SUTURE)
SUT VIC AB 3-0 PS1 18XBRD (SUTURE) IMPLANT
SUT VLOC 180 P-14 24 (SUTURE) ×4 IMPLANT
SYR 50ML LL SCALE MARK (SYRINGE) ×2 IMPLANT
SYR BULB IRRIG 60ML STRL (SYRINGE) ×2 IMPLANT
SYR CONTROL 10ML LL (SYRINGE) IMPLANT
TAPE MEASURE VINYL STERILE (MISCELLANEOUS) IMPLANT
TOWEL GREEN STERILE FF (TOWEL DISPOSABLE) ×4 IMPLANT
TUBE CONNECTING 20X1/4 (TUBING) ×2 IMPLANT
TUBING INFILTRATION IT-10001 (TUBING) ×2 IMPLANT
UNDERPAD 30X36 HEAVY ABSORB (UNDERPADS AND DIAPERS) ×4 IMPLANT
YANKAUER SUCT BULB TIP NO VENT (SUCTIONS) ×2 IMPLANT

## 2021-03-20 NOTE — Discharge Instructions (Addendum)
Activity As tolerated: NO showers for 3 days. Keep ACE wrap on breasts until then. After showering, put ACE wrap back on, this is important for compression. NO driving while in pain, taking pain medication or if you are unable to safely react to traffic. No heavy activities Take Pain medication (Norco) as needed for severe pain. Otherwise, you can use ibuprofen or tylenol as needed. Avoid more than 3,000 mg of tylenol in 24 hours. Norco has 325mg  of tylenol per dose.  Diet: Regular. Drink plenty of fluids and eat healthy, high protein, low carbs.  Wound Care: Keep dressing clean & dry. You may change bandages after showering if you continue to notice some drainage. You can reuse bandages if they are not dirty/soiled.  Special Instructions: Call Doctor if any unusual problems occur such as pain, excessive Bleeding, unrelieved Nausea/vomiting, Fever &/or chills  Follow-up appointment: Scheduled for next week.   Post Anesthesia Home Care Instructions  Activity: Get plenty of rest for the remainder of the day. A responsible individual must stay with you for 24 hours following the procedure.  For the next 24 hours, DO NOT: -Drive a car -Paediatric nurse -Drink alcoholic beverages -Take any medication unless instructed by your physician -Make any legal decisions or sign important papers.  Meals: Start with liquid foods such as gelatin or soup. Progress to regular foods as tolerated. Avoid greasy, spicy, heavy foods. If nausea and/or vomiting occur, drink only clear liquids until the nausea and/or vomiting subsides. Call your physician if vomiting continues.  Special Instructions/Symptoms: Your throat may feel dry or sore from the anesthesia or the breathing tube placed in your throat during surgery. If this causes discomfort, gargle with warm salt water. The discomfort should disappear within 24 hours.  If you had a scopolamine patch placed behind your ear for the management of post-  operative nausea and/or vomiting:  1. The medication in the patch is effective for 72 hours, after which it should be removed.  Wrap patch in a tissue and discard in the trash. Wash hands thoroughly with soap and water. 2. You may remove the patch earlier than 72 hours if you experience unpleasant side effects which may include dry mouth, dizziness or visual disturbances. 3. Avoid touching the patch. Wash your hands with soap and water after contact with the patch.

## 2021-03-20 NOTE — Transfer of Care (Signed)
Immediate Anesthesia Transfer of Care Note  Patient: Erika Fleming  Procedure(s) Performed: MAMMARY REDUCTION  (BREAST) (Bilateral: Breast)  Patient Location: PACU  Anesthesia Type:General  Level of Consciousness: drowsy and patient cooperative  Airway & Oxygen Therapy: Patient Spontanous Breathing and Patient connected to face mask oxygen  Post-op Assessment: Report given to RN and Post -op Vital signs reviewed and stable  Post vital signs: Reviewed and stable  Last Vitals:  Vitals Value Taken Time  BP    Temp    Pulse 65 03/20/21 0938  Resp    SpO2 97 % 03/20/21 0938  Vitals shown include unvalidated device data.  Last Pain:  Vitals:   03/20/21 0642  TempSrc: Oral  PainSc: 0-No pain         Complications: No notable events documented.

## 2021-03-20 NOTE — Anesthesia Preprocedure Evaluation (Addendum)
Anesthesia Evaluation  Patient identified by MRN, date of birth, ID band Patient awake    Reviewed: Allergy & Precautions, NPO status , Patient's Chart, lab work & pertinent test results  Airway Mallampati: I  TM Distance: >3 FB Neck ROM: Full    Dental no notable dental hx.    Pulmonary neg pulmonary ROS, former smoker,    Pulmonary exam normal breath sounds clear to auscultation       Cardiovascular negative cardio ROS Normal cardiovascular exam Rhythm:Regular Rate:Normal  ECG: NSR, rate 82   Neuro/Psych  Headaches, PSYCHIATRIC DISORDERS Depression    GI/Hepatic negative GI ROS, Neg liver ROS,   Endo/Other  negative endocrine ROS  Renal/GU negative Renal ROS     Musculoskeletal negative musculoskeletal ROS (+)   Abdominal (+) + obese,   Peds  Hematology  (+) anemia ,   Anesthesia Other Findings   Reproductive/Obstetrics hcg negative                             Anesthesia Physical  Anesthesia Plan  ASA: II  Anesthesia Plan: General   Post-op Pain Management:    Induction: Intravenous  PONV Risk Score and Plan: 3 and Ondansetron, Dexamethasone, Midazolam and Treatment may vary due to age or medical condition  Airway Management Planned: LMA  Additional Equipment:   Intra-op Plan:   Post-operative Plan: Extubation in OR  Informed Consent: I have reviewed the patients History and Physical, chart, labs and discussed the procedure including the risks, benefits and alternatives for the proposed anesthesia with the patient or authorized representative who has indicated his/her understanding and acceptance.     Dental advisory given  Plan Discussed with: CRNA  Anesthesia Plan Comments:        Anesthesia Quick Evaluation

## 2021-03-20 NOTE — Anesthesia Procedure Notes (Signed)
Procedure Name: LMA Insertion Date/Time: 03/20/2021 7:58 AM Performed by: Signe Colt, CRNA Pre-anesthesia Checklist: Patient identified, Emergency Drugs available, Suction available and Patient being monitored Patient Re-evaluated:Patient Re-evaluated prior to induction Oxygen Delivery Method: Circle System Utilized Preoxygenation: Pre-oxygenation with 100% oxygen Induction Type: IV induction Ventilation: Mask ventilation without difficulty LMA: LMA inserted LMA Size: 4.0 Number of attempts: 1 Airway Equipment and Method: bite block Placement Confirmation: positive ETCO2 Tube secured with: Tape Dental Injury: Teeth and Oropharynx as per pre-operative assessment

## 2021-03-20 NOTE — Interval H&P Note (Signed)
History and Physical Interval Note:  03/20/2021 7:48 AM  Erika Fleming  has presented today for surgery, with the diagnosis of macromastia.  The various methods of treatment have been discussed with the patient and family. After consideration of risks, benefits and other options for treatment, the patient has consented to  Procedure(s): MAMMARY REDUCTION  (BREAST) (Bilateral) as a surgical intervention.  The patient's history has been reviewed, patient examined, no change in status, stable for surgery.  I have reviewed the patient's chart and labs.  Questions were answered to the patient's satisfaction.     [name]

## 2021-03-20 NOTE — Anesthesia Postprocedure Evaluation (Signed)
Anesthesia Post Note  Patient: Erika Fleming  Procedure(s) Performed: MAMMARY REDUCTION  (BREAST) (Bilateral: Breast)     Patient location during evaluation: PACU Anesthesia Type: General Level of consciousness: awake and alert Pain management: pain level controlled Vital Signs Assessment: post-procedure vital signs reviewed and stable Respiratory status: spontaneous breathing, nonlabored ventilation and respiratory function stable Cardiovascular status: blood pressure returned to baseline and stable Postop Assessment: no apparent nausea or vomiting Anesthetic complications: no   No notable events documented.  Last Vitals:  Vitals:   03/20/21 1015 03/20/21 1059  BP: 117/80 123/73  Pulse: 65 72  Resp: 16 14  Temp:  36.6 C  SpO2: 100% 100%    Last Pain:  Vitals:   03/20/21 1059  TempSrc:   PainSc: [place]

## 2021-03-20 NOTE — Brief Op Note (Signed)
03/20/2021  9:36 AM  PATIENT:  Erika Fleming  35 y.o. female  PRE-OPERATIVE DIAGNOSIS:  macromastia  POST-OPERATIVE DIAGNOSIS:  macromastia  PROCEDURE:  Procedure(s): MAMMARY REDUCTION  (BREAST) (Bilateral)  SURGEON:  Surgeon(s) and Role:    * Darian Ace, Steffanie Dunn, MD - Primary  PHYSICIAN ASSISTANT: Software engineer, PA  ASSISTANTS: none   ANESTHESIA:   general  EBL:  30   BLOOD ADMINISTERED:none  DRAINS: none   LOCAL MEDICATIONS USED:  MARCAINE     SPECIMEN:  Source of Specimen:  r and l breast tissue  DISPOSITION OF SPECIMEN:  PATHOLOGY  COUNTS:  YES  TOURNIQUET:  * No tourniquets in log *  DICTATION: .Dragon Dictation  PLAN OF CARE: Discharge to home after PACU  PATIENT DISPOSITION:  PACU - hemodynamically stable.   Delay start of Pharmacological VTE agent (>24hrs) due to surgical blood loss or risk of bleeding: not applicable

## 2021-03-20 NOTE — Op Note (Signed)
Operative Note   DATE OF OPERATION: 03/20/2021  LOCATION: [facility]   SURGICAL DEPARTMENT: Plastic Surgery  PREOPERATIVE DIAGNOSES: Bilateral symptomatic macromastia.  POSTOPERATIVE DIAGNOSES:  same  PROCEDURE: Bilateral breast reduction with superomedial pedicle.  SURGEON: Talmadge Coventry, MD  ASSISTANT: Verdie Shire, PA The advanced practice practitioner (APP) assisted throughout the case.  The APP was essential in retraction and counter traction when needed to make the case progress smoothly.  This retraction and assistance made it possible to see the tissue planes for the procedure.  The assistance was needed for hemostasis, tissue re-approximation and closure of the incision site.   ANESTHESIA: General.  COMPLICATIONS: None.   INDICATIONS FOR PROCEDURE:  The patient, Erika Fleming is a 35 y.o. female born on 14-Feb-1986, is here for treatment of bilateral symptomatic macromastia. MRN: [id number]  CONSENT:  Informed consent was obtained directly from the patient. Risks, benefits and alternatives were fully discussed. Specific risks including but not limited to bleeding, infection, hematoma, seroma, scarring, pain, infection, contracture, asymmetry, wound healing problems, and need for further surgery were all discussed. The patient did have an ample opportunity to have questions answered to satisfaction.   DESCRIPTION OF PROCEDURE:  The patient was marked preoperatively for a Wise pattern skin excision.  The patient was taken to the operating room. SCDs were placed and antibiotics were given. General anesthesia was administered.The patient's operative site was prepped and draped in a sterile fashion. A time out was performed and all information was confirmed to be correct.  Right Breast: The breast was infiltrated with tumescent solution to help with hemostasis.  The nipple was marked with a cookie cutter.  A superomedial pedicle was drawn out with the base of  at least 8 cm in size.  A breast tourniquet was then applied and the pedicle was de-epithelialized.  Breast tourniquet was then let down and all incisions were made with a 10 blade.  The pedicle was then isolated down to the chest wall with cautery and the excision was performed removing tissue primarily inferiorly and laterally.  Hemostasis was obtained and the wound was stapled closed.  Left breast:  The breast was infiltrated with tumescent solution to help with hemostasis.  The nipple was marked with a cookie cutter.  A superomedial pedicle was drawn out with the base of at least 8 cm in size.  A breast tourniquet was then applied and the pedicle was de-epithelialized.  Breast tourniquet was then let down and all incisions were made with a 10 blade.  The pedicle was then isolated down to the chest wall with cautery and the excision was performed removing tissue primarily inferiorly and laterally.  Hemostasis was obtained and the wound was stapled closed.  Patient was then set up to check for size and symmetry.  Minor modifications were made.  This resulted in a total of 632g removed from the right side and 649g removed from the left side.  The inframammary incision was closed with a combination of buried in-sorb staples and a running 3-0 Quill suture.  The vertical and periareolar limbs were closed with interrupted buried 4-0 Monocryl and a running 4-0 Quill suture.  Steri-Strips were then applied along with a soft dressing and Ace wrap.  The patient tolerated the procedure well.  There were no complications. The patient was allowed to wake from anesthesia, extubated and taken to the recovery room in satisfactory condition.  I was present for the entire procedure.

## 2021-03-21 LAB — SURGICAL PATHOLOGY

## 2021-03-23 ENCOUNTER — Encounter (HOSPITAL_BASED_OUTPATIENT_CLINIC_OR_DEPARTMENT_OTHER): Payer: Self-pay | Admitting: Plastic Surgery

## 2021-03-28 ENCOUNTER — Other Ambulatory Visit: Payer: Self-pay

## 2021-03-28 ENCOUNTER — Ambulatory Visit (INDEPENDENT_AMBULATORY_CARE_PROVIDER_SITE_OTHER): Payer: Commercial Managed Care - PPO | Admitting: Plastic Surgery

## 2021-03-28 DIAGNOSIS — N62 Hypertrophy of breast: Secondary | ICD-10-CM

## 2021-03-28 NOTE — Progress Notes (Signed)
Patient presents 1 week postop from bilateral breast reduction.  Everything looks to be healing well.  Nipple areolar complexes are viable.  Minimal swelling.  We will plan to continue compressive garments and avoid strenuous activity.  We will see her again in 2 weeks.  All of her questions were answered.

## 2021-04-04 ENCOUNTER — Other Ambulatory Visit: Payer: Self-pay

## 2021-04-04 ENCOUNTER — Ambulatory Visit: Payer: Commercial Managed Care - PPO | Attending: Obstetrics and Gynecology | Admitting: Physical Therapy

## 2021-04-04 ENCOUNTER — Encounter: Payer: Self-pay | Admitting: Physical Therapy

## 2021-04-04 DIAGNOSIS — R279 Unspecified lack of coordination: Secondary | ICD-10-CM | POA: Insufficient documentation

## 2021-04-04 DIAGNOSIS — R293 Abnormal posture: Secondary | ICD-10-CM | POA: Diagnosis present

## 2021-04-04 DIAGNOSIS — M6281 Muscle weakness (generalized): Secondary | ICD-10-CM | POA: Diagnosis present

## 2021-04-04 NOTE — Patient Instructions (Signed)
Bladder Irritants  Certain foods and beverages can be irritating to the bladder.  Avoiding these irritants may decrease your symptoms of urinary urgency, frequency or bladder pain.  Even reducing your intake can help with your symptoms.  Not everyone is sensitive to all bladder irritants, so you may consider focusing on one irritant at a time, removing or reducing your intake of that irritant for 7-10 days to see if this change helps your symptoms.  Water intake is also very important.  Below is a list of bladder irritants.  Drinks: alcohol, carbonated beverages, caffeinated beverages such as coffee and tea, drinks with artificial sweeteners, citrus juices, apple juice, tomato juice  Foods: tomatoes and tomato based foods, spicy food, sugar and artificial sweeteners, vinegar, chocolate, raw onion, apples, citrus fruits, pineapple, cranberries, tomatoes, strawberries, plums, peaches, cantaloupe  Other: acidic urine (too concentrated) - see water intake info below  Substitutes you can try that are NOT irritating to the bladder: cooked onion, pears, papayas, sun-brewed decaf teas, watermelons, non-citrus herbal teas, apricots, kava and low-acid instant drinks (Postum).    WATER INTAKE: Remember to drink lots of water (aim for fluid intake of half your body weight with 2/3 of fluids being water).  You may be limiting fluids due to fear of leakage, but this can actually worsen urgency symptoms due to highly concentrated urine.  Water helps balance the pH of your urine so it doesn't become too acidic - acidic urine is a bladder irritant!   Moisturizers They are used in the vagina to hydrate the mucous membrane that make up the vaginal canal. Designed to keep a more normal acid balance (ph) Once placed in the vagina, it will last between two to three days.  Use 2-3 times per week at bedtime  Ingredients to avoid is glycerin and fragrance, can increase chance of infection Should not be used just  before sex due to causing irritation Most are gels administered either in a tampon-shaped applicator or as a vaginal suppository. They are non-hormonal.   Types of Moisturizers(internal use)  Vitamin E vaginal suppositories- Whole foods, Amazon Moist Again Coconut oil- can break down condoms Julva- (Do no use if on Tamoxifen) amazon Yes moisturizer- amazon NeuEve Silk , NeuEve Silver for menopausal or over 65 (if have severe vaginal atrophy or cancer treatments use NeuEve Silk for  1 month than move to [employer] Pepsi)- [employer], [place].com Olive and Bee intimate cream- www.oliveandbee.com.au Mae vaginal moisturizer- Amazon Aloe    Creams to use externally on the Vulva area Albertson's (good for for cancer patients that had radiation to the area)- [place] or [employer].FlyingBasics.com.br V-magic cream - amazon Julva-amazon Vital "V Wild Yam salve ( help moisturize and help with thinning vulvar area, does have Catherine by Irwin Brakeman labial moisturizer (Amazon,  Coconut or olive oil aloe   Things to avoid in the vaginal area Do not use things to irritate the vulvar area No lotions just specialized creams for the vulva area- Neogyn, V-magic, No soaps; can use Aveeno or Calendula cleanser if needed. Must be gentle No deodorants No douches Good to sleep without underwear to let the vaginal area to air out No scrubbing: spread the lips to let warm water rinse over labias and pat dry  Lubrication Used for intercourse to reduce friction Avoid ones that have glycerin, warming gels, tingling gels, icing or cooling gel, scented Avoid parabens due to a preservative similar to female sex  hormone May need to be reapplied once or several times during sexual activity Can be applied to both partners genitals prior to vaginal penetration to minimize friction or irritation Prevent irritation and mucosal tears that cause  post coital pain and increased the risk of vaginal and urinary tract infections Oil-based lubricants cannot be used with condoms due to breaking them down.  Least likely to irritate vaginal tissue.  Plant based-lubes are safe Silicone-based lubrication are thicker and last long and used for post-menopausal women  Vaginal Lubricators Here is a list of some suggested lubricators you can use for intercourse. Use the most hypoallergenic product.  You can place on you or your partner.  Slippery Stuff ( water based) Sylk or Sliquid Natural H2O ( good  if frequent UTI's)- walmart, amazon Sliquid organics silk-(aloe and silicone based ) [employer] (www.blossom-organics.com)- (aloe based ) Coconut oil, olive oil -not good with condoms  PJur Woman Nude- (water based) amazon Uberlube- ( silicon) [place] has an organic one Yes lubricant- (water based and has plant oil based similar to silicone) [employer] Platinum-Silicone, Target, [facility] Olive and Bee intimate cream-  www.oliveandbee.com.au Pink - [employer] stuff Erosense Sync- walmart, amazon Coconu- FailLinks.co.uk  Things to avoid in lubricants are glycerin, warming gels, tingling gels, icing or cooling  gels, and scented gels.  Also avoid Vaseline. KY jelly, Replens, and Astroglide contain chlorhexidine which kills good bacteria(lactobacilli)  Things to avoid in the vaginal area Do not use things to irritate the vulvar area No lotions- see below Soaps you  can use :Aveeno, Calendula, Good Clean Love cleanser if needed. Must be gentle No deodorants No douches Good to sleep without underwear to let the vaginal area to air out No scrubbing: spread the lips to let warm water rinse over labias and pat dry  Creams that can be used on the Vulva Area V [employer], walmart Vital V Wild Yam Salve Julva- [employer] Botanical Pro-Meno Wild Yam Cream Coconut oil, olive oil Cleo by [employer] labial moisturizer -Christie,   [place] Releveum ( lidocaine) or Desert [employer] Yes Moisturizer

## 2021-04-04 NOTE — Therapy (Addendum)
[facility] @ [address] [place], [place], [address] Phone: [phone]   Fax:  [phone]  Physical Therapy Evaluation  Patient Details  Name: Erika Fleming MRN: [id number] Date of Birth: 1985/06/22 Referring Provider (PT): Drema Dallas, DO   Encounter Date: 04/04/2021   PT End of Session - 04/04/21 1320     Visit Number 1    Date for PT Re-Evaluation 08/05/21    Authorization Type [employer] UMR/UHC PPO    PT Start Time 1230    PT Stop Time 1315    PT Time Calculation (min) 45 min    Activity Tolerance Patient tolerated treatment well    Behavior During Therapy [facility] for tasks assessed/performed             Past Medical History:  Diagnosis Date   Abnormal uterine bleeding (AUB)    Anemia    Depression    Headache    Wears glasses     Past Surgical History:  Procedure Laterality Date   BREAST REDUCTION SURGERY Bilateral 03/20/2021   Procedure: MAMMARY REDUCTION  (BREAST);  Surgeon: [name], MD;  Location: [facility];  Service: Plastics;  Laterality: Bilateral;   NO PAST SURGERIES     ROBOTIC ASSISTED LAPAROSCOPIC HYSTERECTOMY AND SALPINGECTOMY Bilateral 09/20/2020   Procedure: XI ROBOTIC ASSISTED LAPAROSCOPIC HYSTERECTOMY AND BILATER SALPINGECTOMY;  Surgeon: Drema Dallas, DO;  Location: [facility];  Service: Gynecology;  Laterality: Bilateral;    There were no vitals filed for this visit.    Subjective Assessment - 04/04/21 1235     Subjective Pt reports she has had urinary incontinence since hysterectomy 09/20/20, doesn't feel like she fully empties, does have coccyx pain when sitting on harder surfaces does go away with time. Urinary leakage only noticed wtih dampness in underwear when goes to bathroom or smell of urine in underwear, does have urgency.    How long can you sit comfortably? depends on the chair 30 mins on harder chair    How long can you  stand comfortably? no limits    How long can you walk comfortably? no limits    Patient Stated Goals to have less leakage    Currently in Pain? No/denies                [facility] PT Assessment - 04/04/21 0001       Assessment   Medical Diagnosis R32    Referring Provider (PT) Drema Dallas, DO    Onset Date/Surgical Date --   at least since august 2022   Prior Therapy no      Precautions   Precautions None      Restrictions   Weight Bearing Restrictions No      Balance Screen   Has the patient fallen in the past 6 months No    Has the patient had a decrease in activity level because of a fear of falling?  No      Home Ecologist residence    Living Arrangements Spouse/significant other      Prior Function   Level of Independence Independent    Vocation Full time employment    Soil scientist, increased sitting      Cognition   Overall Cognitive Status Within Functional Limits for tasks assessed      Sensation   Light Touch Appears Intact      Coordination   Gross Motor Movements are  Fluid and Coordinated Yes    Fine Motor Movements are Fluid and Coordinated Yes      Posture/Postural Control   Posture/Postural Control Postural limitations    Postural Limitations Rounded Shoulders;Posterior pelvic tilt;Increased thoracic kyphosis      ROM / Strength   AROM / PROM / Strength AROM;Strength      AROM   Overall AROM  Within functional limits for tasks performed    Overall AROM Comments thoracic and lumbar spine limited in side bending, rotation and flexion by 25%      Strength   Overall Strength Comments hips grossly 4/5      Flexibility   Soft Tissue Assessment /Muscle Length yes   adductors and hamstrings limited by 25%     Palpation   Palpation comment no TTP                  No emotional/communication barriers or cognitive limitation. Patient is motivated to learn. Patient understands and  agrees with treatment goals and plan. PT explains patient will be examined in standing, sitting, and lying down to see how their muscles and joints work. When they are ready, they will be asked to remove their underwear so PT can examine their perineum. The patient is also given the option of providing their own chaperone as one is not provided in our facility. The patient also has the right and is explained the right to defer or refuse any part of the evaluation or treatment including the internal exam. With the patient's consent, PT will use one gloved finger to gently assess the muscles of the pelvic floor, seeing how well it contracts and relaxes and if there is muscle symmetry. After, the patient will get dressed and PT and patient will discuss exam findings and plan of care. PT and patient discuss plan of care, schedule, attendance policy and HEP activities.       Objective measurements completed on examination: See above findings.     Pelvic Floor Special Questions - 04/04/21 0001     Prior Pelvic/Prostate Exam Yes   normal   Are you Pregnant or attempting pregnancy? No    Prior Pregnancies No    Currently Sexually Active Yes    Is this Painful Yes   dryness and burning/irritation felt   History of sexually transmitted disease No    Marinoff Scale no problems    Urinary Leakage Yes    How often daily- unsure when or with activities    Pad use no    Activities that cause leaking Other   unsure when, but doesn't have it urge, sneezing or coughing   Urinary urgency Yes    Urinary frequency no    Fecal incontinence No    Fluid intake sometimes but not regularly    Caffeine beverages 1-2cups per day    Falling out feeling (prolapse) No    External Perineal Exam WFL    Prolapse None    Pelvic Floor Internal Exam patient identified and patient confirms consent for PT to perform internal soft tissue work and muscle strength and integrity assessment    Exam Type Vaginal    Sensation WFL     Palpation no TTP, one small trigger point and mild tenderness in coccygeus    Strength weak squeeze, no lift   with increased reps increased to 3/5   Strength # of reps 5    Strength # of seconds 4    Tone mildly increased  PT Education - 04/04/21 1319     Education Details Pt educated on exam findings, POC, HEP, bladder irritants, bladder diary, and vaginal mositurizers/lubricants and samples given    Person(s) Educated Patient    Methods Explanation;Demonstration;Tactile cues;Verbal cues;Handout    Comprehension Verbalized understanding;Returned demonstration              PT Short Term Goals - 04/04/21 1328       PT SHORT TERM GOAL #1   Title pt to be I with HEP    Time 6    Period Weeks    Status New    Target Date 05/16/21      PT SHORT TERM GOAL #2   Title pt to report no more than one leak per day with pt to self monitor for leaks throughout the day to decrease leakage episodes    Time 6    Period Weeks    Status New    Target Date 05/16/21      PT SHORT TERM GOAL #3   Title pt to demostrate at least 3/5 pelvic floor strength to decrease urinary leakage symptoms    Time 6    Period Weeks    Status New    Target Date 05/16/21      PT SHORT TERM GOAL #4   Title pt to demonstrate improved ability to coordinate breathing mechanics with pelvic floor/core contract/relaxation with activities at least 50% of the time to decrease leakage.    Time 6    Period Weeks    Status New    Target Date 05/16/21               PT Long Term Goals - 04/04/21 1415       PT LONG TERM GOAL #1   Title pt to be I with advanced HEP    Time 64    Period Months    Status New    Target Date 08/05/21      PT LONG TERM GOAL #2   Title pt to report no more than one leak per week with pt to self monitor for leaks throughout the day to decrease leakage episodes    Time 4    Period Months    Status New    Target Date 08/05/21      PT  LONG TERM GOAL #3   Title pt to demostrate at least 4/5 pelvic floor strength to decrease urinary leakage symptoms    Time 4    Period Months    Status New    Target Date 08/05/21      PT LONG TERM GOAL #4   Title pt to demonstrate improved ability to coordinate breathing mechanics with pelvic floor/core contract/relaxation with activities at least 75% of the time to decrease leakage.    Time 4    Period Months    Status New    Target Date 08/05/21      PT LONG TERM GOAL #5   Title pt to demonstrate good posture and lifting mechanics of 20# from ground level to improve pelvic stability and decrease compensatory strategies and strain at pelvic floor.    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 Pt unsure when she leaks during the day but reports she notices dampness in underwear at end of day and sometimes some urine odor. Pt reports she doesn't think she leaks with stressors and doesn't with strong urge. Pt has has increased urgency but not frequency. Pt found to have pelvic floor weakness, decreased endurance and coordination; thoracic and lumbar decreased mobility, decreased hip mobility. Pt had no pain throughout and had multiple questions about pelvic floor and all answered. Pt educated throughout evaluation on PF, bladder retraining, and vaginal lubricants/moisturizers with handouts given for all. Pt would benefit from additional PT to address deficits and decrease leakage.    Personal Factors and Comorbidities Comorbidity 2    Comorbidities macromastia, hysterectomy    Examination-Activity Limitations Continence   intercourse   Examination-Participation Restrictions Community Activity;Interpersonal Relationship     Stability/Clinical Decision Making Stable/Uncomplicated    Clinical Decision Making Low    Rehab Potential Good    PT Frequency Biweekly    PT Duration Other (comment)   16 weeks   PT Treatment/Interventions ADLs/Self Care Home Management;Functional mobility training;Therapeutic activities;Therapeutic exercise;Neuromuscular re-education;Manual techniques;Patient/family education;Taping;Passive range of motion;Scar mobilization    PT Next Visit Plan go over all handouts, coordinate breathing with activity    Consulted and Agree with Plan of Care Patient             Patient will benefit from skilled therapeutic intervention in order to improve the following deficits and impairments:  Decreased endurance, Decreased coordination, Increased fascial restricitons, Postural dysfunction, Decreased strength, Decreased mobility, Impaired flexibility, Improper body mechanics  Visit Diagnosis:  Muscle weakness (generalized) - Plan: PT plan of care cert/re-cert  Abnormal posture - Plan: PT plan of care cert/re-cert     Problem List Patient Active Problem List   Diagnosis Date Noted   Abnormal uterine bleeding (AUB) 09/08/2020   Chronic blood loss anemia 09/08/2020   Chronic seasonal allergic rhinitis 09/06/2016   Congenital hearing loss of left ear 09/06/2016   ETD (Eustachian tube dysfunction), bilateral 09/06/2016  Stacy Gardner, PT, DPT 10/26/222:19 PM   [facility] @ [place] [address] [place], [place], [address] Phone: [phone]   Fax:  [phone]  Name: Erika Fleming MRN: [id number] Date of Birth: 02/02/1986

## 2021-04-12 ENCOUNTER — Encounter: Payer: Commercial Managed Care - PPO | Admitting: Surgical

## 2021-04-12 ENCOUNTER — Other Ambulatory Visit: Payer: Self-pay

## 2021-04-12 ENCOUNTER — Ambulatory Visit (INDEPENDENT_AMBULATORY_CARE_PROVIDER_SITE_OTHER): Payer: Commercial Managed Care - PPO | Admitting: Surgical

## 2021-04-12 DIAGNOSIS — M545 Low back pain, unspecified: Secondary | ICD-10-CM

## 2021-04-12 DIAGNOSIS — N62 Hypertrophy of breast: Secondary | ICD-10-CM

## 2021-04-12 DIAGNOSIS — M546 Pain in thoracic spine: Secondary | ICD-10-CM

## 2021-04-12 DIAGNOSIS — M4004 Postural kyphosis, thoracic region: Secondary | ICD-10-CM

## 2021-04-12 NOTE — Progress Notes (Signed)
Patient is a 35 year old female here for follow-up after bilateral breast reduction with Dr. Claudia Desanctis on 03/20/2021.  She is 3 weeks postop.  She is doing really well.   Chaperone present on exam On exam bilateral NAC's are viable, bilateral breast incisions are intact and healing well.  There is no erythema or cellulitic changes noted.  There is no foul odors.  She does have a few small scabbing areas along bilateral NAC's but these are healing well. Bilateral breasts are soft, no subcutaneous fluid collections noted with palpation.  Recommend continue her compressive garments for 3 more weeks.  Avoid strenuous activities for 3 more weeks.  Recommend following up in 3 to 4 weeks for reevaluation.  Discussed with patient if she is doing well she can cancel that appointment.  Pictures were taken and placed in the patient's chart with patient's permission.  I do not see any signs of infection on exam.

## 2021-04-24 ENCOUNTER — Encounter: Payer: Commercial Managed Care - PPO | Admitting: Physical Therapy

## 2021-04-25 ENCOUNTER — Emergency Department (HOSPITAL_COMMUNITY)
Admission: EM | Admit: 2021-04-25 | Discharge: 2021-04-26 | Disposition: A | Discharge status: Home / Self Care | Payer: Commercial Managed Care - PPO | Attending: Emergency Medicine | Admitting: Emergency Medicine

## 2021-04-25 DIAGNOSIS — I82409 Acute embolism and thrombosis of unspecified deep veins of unspecified lower extremity: Secondary | ICD-10-CM | POA: Insufficient documentation

## 2021-04-25 DIAGNOSIS — Z5321 Procedure and treatment not carried out due to patient leaving prior to being seen by health care provider: Secondary | ICD-10-CM | POA: Insufficient documentation

## 2021-04-25 DIAGNOSIS — M79604 Pain in right leg: Secondary | ICD-10-CM | POA: Diagnosis not present

## 2021-04-25 DIAGNOSIS — R200 Anesthesia of skin: Secondary | ICD-10-CM | POA: Diagnosis not present

## 2021-04-25 DIAGNOSIS — M79605 Pain in left leg: Secondary | ICD-10-CM | POA: Insufficient documentation

## 2021-04-25 LAB — COMPREHENSIVE METABOLIC PANEL
ALT: 21 U/L (ref 0–44)
AST: 19 U/L (ref 15–41)
Albumin: 3.7 g/dL (ref 3.5–5.0)
Alkaline Phosphatase: 69 U/L (ref 38–126)
Anion gap: 10 (ref 5–15)
BUN: 13 mg/dL (ref 6–20)
CO2: 24 mmol/L (ref 22–32)
Calcium: 9 mg/dL (ref 8.9–10.3)
Chloride: 103 mmol/L (ref 98–111)
Creatinine, Ser: 0.73 mg/dL (ref 0.44–1.00)
GFR, Estimated: >60 mL/min (ref >60)
Glucose, Bld: 112 mg/dL — ABNORMAL HIGH (ref 70–99)
Potassium: 3.6 mmol/L (ref 3.5–5.1)
Sodium: 137 mmol/L (ref 135–145)
Total Bilirubin: 0.3 mg/dL (ref 0.3–1.2)
Total Protein: 7.1 g/dL (ref 6.5–8.1)

## 2021-04-25 LAB — CBC
HCT: 39.7 % (ref 36.0–46.0)
Hemoglobin: 12.5 g/dL (ref 12.0–15.0)
MCH: 27.2 pg (ref 26.0–34.0)
MCHC: 31.5 g/dL (ref 30.0–36.0)
MCV: 86.5 fL (ref 80.0–100.0)
Platelets: 306 10*3/uL (ref 150–400)
RBC: 4.59 MIL/uL (ref 3.87–5.11)
RDW: 16.7 % — ABNORMAL HIGH (ref 11.5–15.5)
WBC: 9.5 10*3/uL (ref 4.0–10.5)
nRBC: 0 % (ref 0.0–0.2)

## 2021-04-25 LAB — D-DIMER, QUANTITATIVE: D-Dimer, Quant: 0.6 ug/mL-FEU — ABNORMAL HIGH (ref 0.00–0.50)

## 2021-04-25 NOTE — ED Triage Notes (Signed)
Pt c/o bilateral leg pain, warm sensation x1-2 days. No loss of sensation/weakness. Breast reduction surgery approx 2wks ago, no complications. Concern for DVT.

## 2021-04-25 NOTE — ED Provider Notes (Signed)
Emergency Medicine Provider Triage Evaluation Note  Erika Fleming , a 35 y.o. female  was evaluated in triage.  Pt complains of bilateral leg swelling and warm sensation.  No distal loss of sensation or weakness.  She has a notable history of breast reduction surgery about 2 weeks ago.  This was reportedly uncomplicated, and she has been doing generally well until the past day when she developed swelling sensation in both legs, as well as warm sensation.  It specifically the patient has a concern for development of blood clots.  Risk profile is otherwise unremarkable.  Review of Systems  Positive: Dysesthesia both legs, swelling perceived Negative: No chest pain, no dyspnea, no syncope, no vomiting, no fever  Physical Exam  BP 128/78 (BP Location: Right Arm)   Pulse 85   Temp 98.4 F (36.9 C) (Oral)   Resp 16   LMP 09/12/2020   SpO2 99%  Gen:   Awake, no distress speaking clearly Resp:  Normal effort no increased work of breathing MSK:   Moves extremities without difficulty no obvious asymmetry of size Neuro: No focal abnormalities speak clearly, face is symmetric  Medical Decision Making  Medically screening exam initiated at 8:32 PM.  Appropriate orders placed.  Erika Fleming was informed that the remainder of the evaluation will be completed by another provider, this initial triage assessment does not replace that evaluation, and the importance of remaining in the ED until their evaluation is complete.   Erika Muskrat, MD 04/25/21 2033

## 2021-04-26 ENCOUNTER — Other Ambulatory Visit (HOSPITAL_BASED_OUTPATIENT_CLINIC_OR_DEPARTMENT_OTHER): Payer: Self-pay | Admitting: Family Medicine

## 2021-04-26 ENCOUNTER — Other Ambulatory Visit: Payer: Self-pay

## 2021-04-26 ENCOUNTER — Ambulatory Visit (HOSPITAL_BASED_OUTPATIENT_CLINIC_OR_DEPARTMENT_OTHER)
Admission: RE | Admit: 2021-04-26 | Discharge: 2021-04-26 | Disposition: A | Discharge status: Home / Self Care | Payer: Commercial Managed Care - PPO | Source: Ambulatory Visit | Attending: Family Medicine | Admitting: Family Medicine

## 2021-04-26 DIAGNOSIS — R200 Anesthesia of skin: Secondary | ICD-10-CM

## 2021-04-26 DIAGNOSIS — M25569 Pain in unspecified knee: Secondary | ICD-10-CM

## 2021-04-26 NOTE — ED Notes (Signed)
Called multiple times for room, no answer

## 2021-04-26 NOTE — ED Notes (Signed)
Pt called X3. Pt could not be found.

## 2021-05-15 ENCOUNTER — Ambulatory Visit: Payer: Commercial Managed Care - PPO | Admitting: Surgical

## 2021-05-16 ENCOUNTER — Encounter: Payer: Commercial Managed Care - PPO | Admitting: Physical Therapy

## 2021-05-30 ENCOUNTER — Encounter: Payer: Commercial Managed Care - PPO | Admitting: Physical Therapy

## 2021-06-13 ENCOUNTER — Encounter: Payer: Commercial Managed Care - PPO | Admitting: Physical Therapy

## 2021-06-27 ENCOUNTER — Encounter: Payer: Commercial Managed Care - PPO | Admitting: Physical Therapy

## 2022-01-11 DIAGNOSIS — J00 Acute nasopharyngitis [common cold]: Secondary | ICD-10-CM | POA: Diagnosis not present

## 2022-01-11 DIAGNOSIS — U071 COVID-19: Secondary | ICD-10-CM | POA: Diagnosis not present

## 2022-05-01 DIAGNOSIS — L718 Other rosacea: Secondary | ICD-10-CM | POA: Diagnosis not present

## 2022-05-01 DIAGNOSIS — L821 Other seborrheic keratosis: Secondary | ICD-10-CM | POA: Diagnosis not present

## 2022-05-07 DIAGNOSIS — Z0000 Encounter for general adult medical examination without abnormal findings: Secondary | ICD-10-CM | POA: Diagnosis not present

## 2022-05-08 DIAGNOSIS — D500 Iron deficiency anemia secondary to blood loss (chronic): Secondary | ICD-10-CM | POA: Diagnosis not present

## 2022-05-08 DIAGNOSIS — Z6834 Body mass index (BMI) 34.0-34.9, adult: Secondary | ICD-10-CM | POA: Diagnosis not present

## 2022-05-08 DIAGNOSIS — Z5181 Encounter for therapeutic drug level monitoring: Secondary | ICD-10-CM | POA: Diagnosis not present

## 2022-05-08 DIAGNOSIS — E559 Vitamin D deficiency, unspecified: Secondary | ICD-10-CM | POA: Diagnosis not present

## 2022-05-08 DIAGNOSIS — Z13220 Encounter for screening for lipoid disorders: Secondary | ICD-10-CM | POA: Diagnosis not present

## 2022-05-08 DIAGNOSIS — Z8249 Family history of ischemic heart disease and other diseases of the circulatory system: Secondary | ICD-10-CM | POA: Diagnosis not present

## 2022-05-08 DIAGNOSIS — R5383 Other fatigue: Secondary | ICD-10-CM | POA: Diagnosis not present

## 2022-07-23 IMAGING — CT CT CHEST W/ CM
2 of 4 series · 11 of 36 positions shown, 13 images · IV contrast (iopamidol)
Comparison: Chest x-ray April 10, 2020

CLINICAL DATA: Abnormal chest x-ray.

EXAM:
CT CHEST WITH CONTRAST
TECHNIQUE: Multidetector CT imaging of the chest was performed during
intravenous contrast administration.
CONTRAST:  75mL [id number] IOPAMIDOL (M8VF17-3BB) INJECTION 61%

[Series 2: chest 2.00 br40 s3 · axial · 0.58mm/px · z∈[+1511,+1741]mm · 8 of 137 slices shown, 10 images (1 of 2)]
[im 11/137  mediastinal]
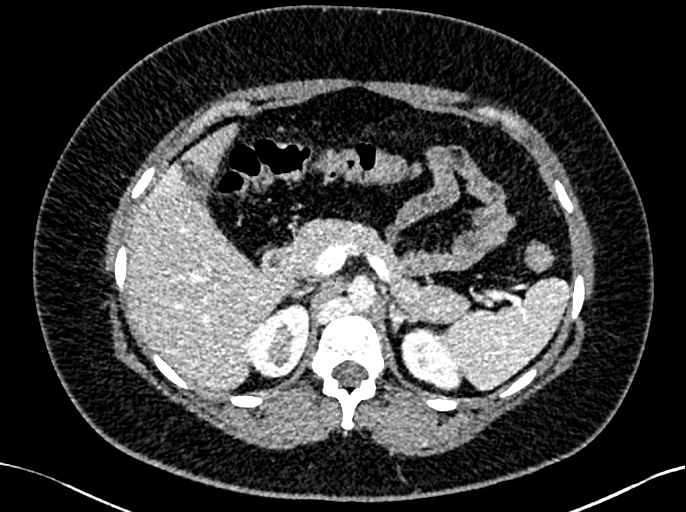
[im 11/137  lung]
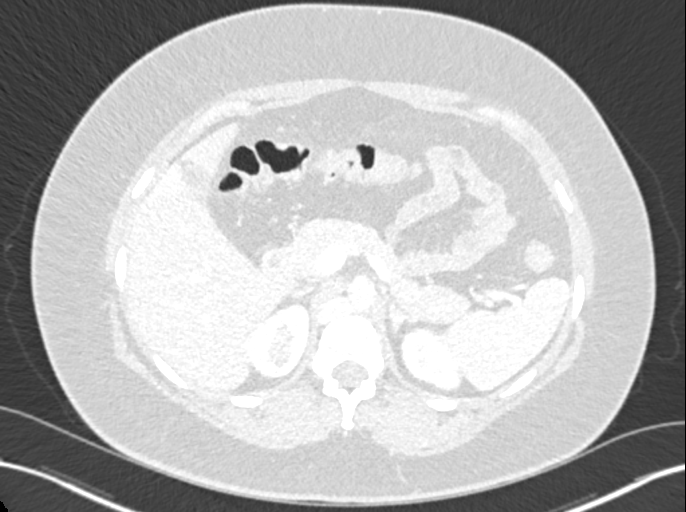
[im 32/137  lung]
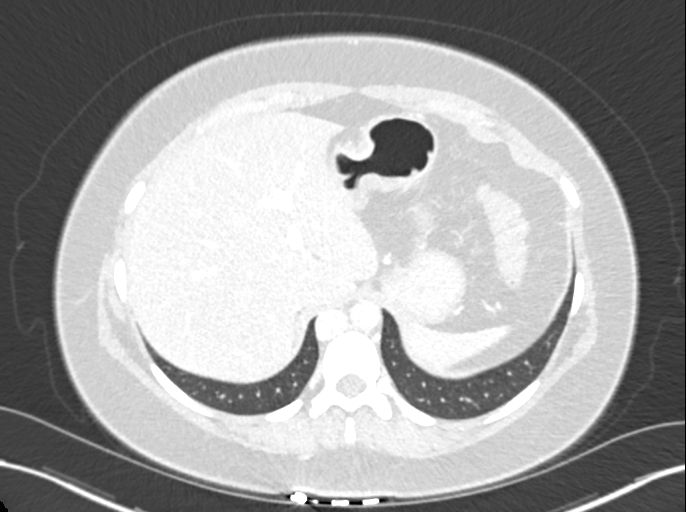
[im 42/137  lung]
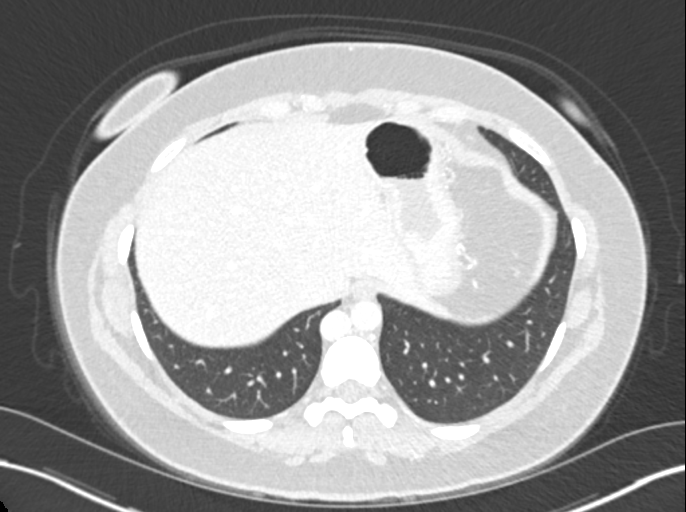
[im 63/137  lung]
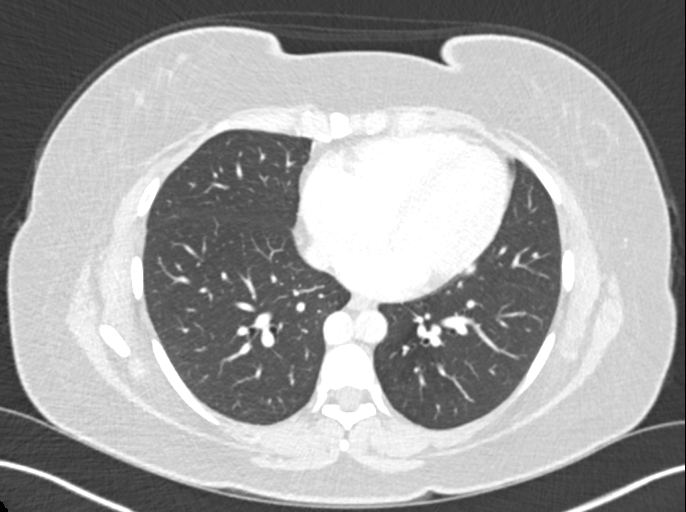
[im 74/137  mediastinal]
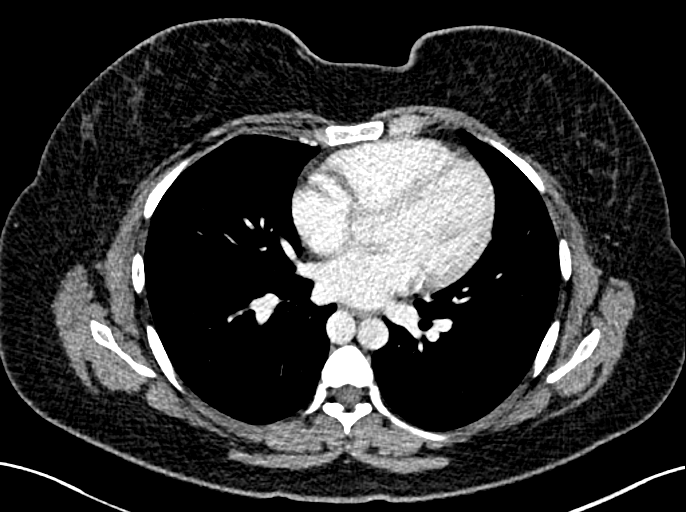
[im 74/137  lung]
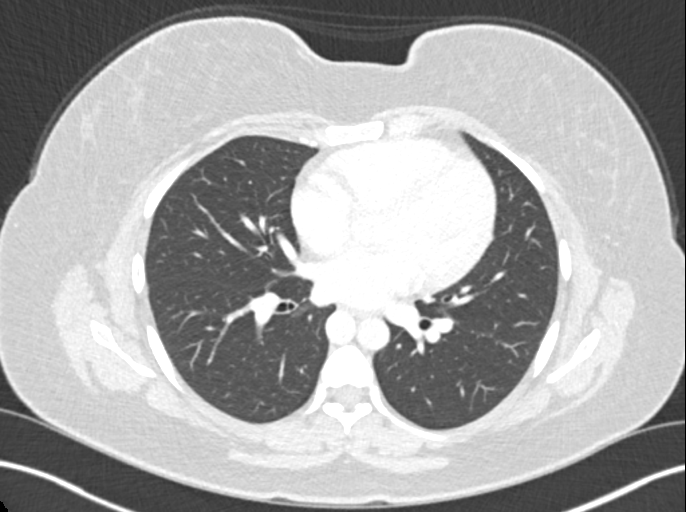
[im 95/137  lung]
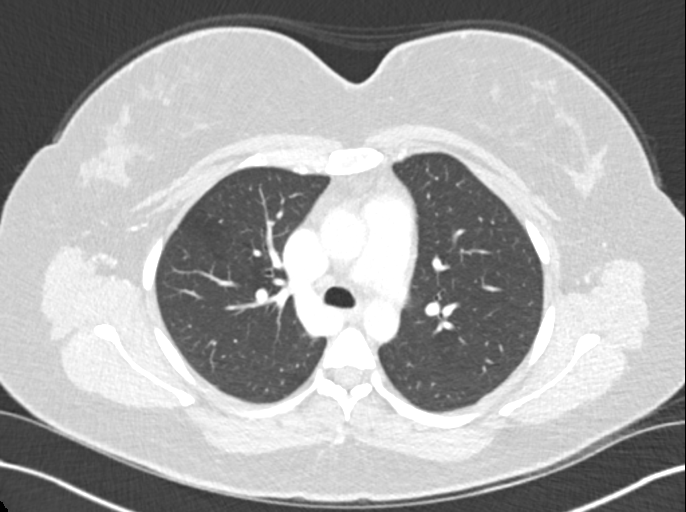
[im 105/137  lung]
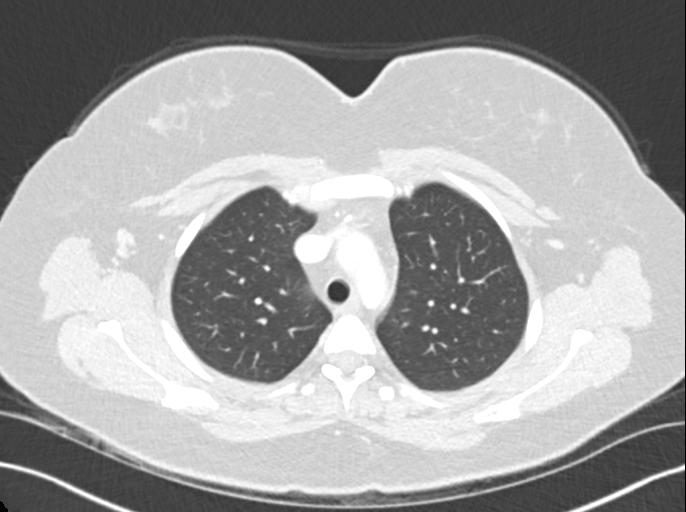
[im 126/137  lung]
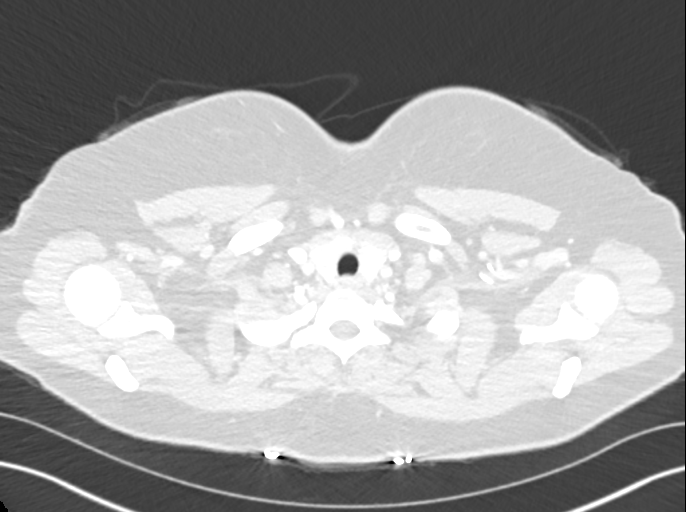

[Series 4: chest 2.00 br40 s3 · coronal · 0.54mm/px · 3 of 147 slices shown (2 of 2)]
[im 30/147  lung]
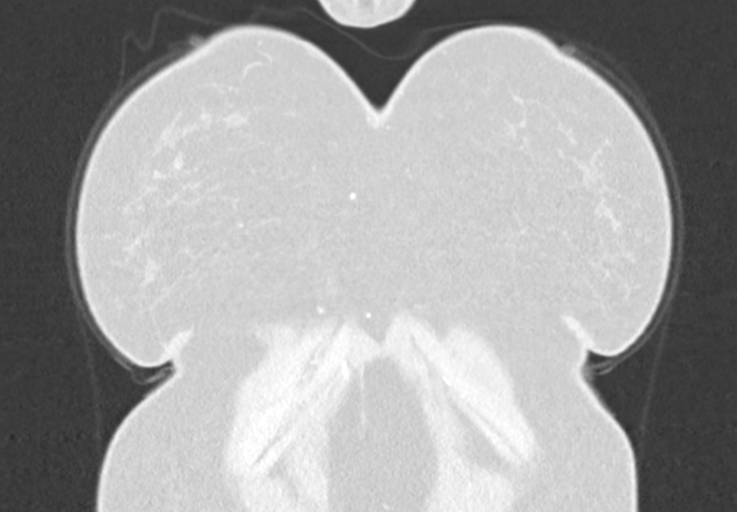
[im 59/147  lung]
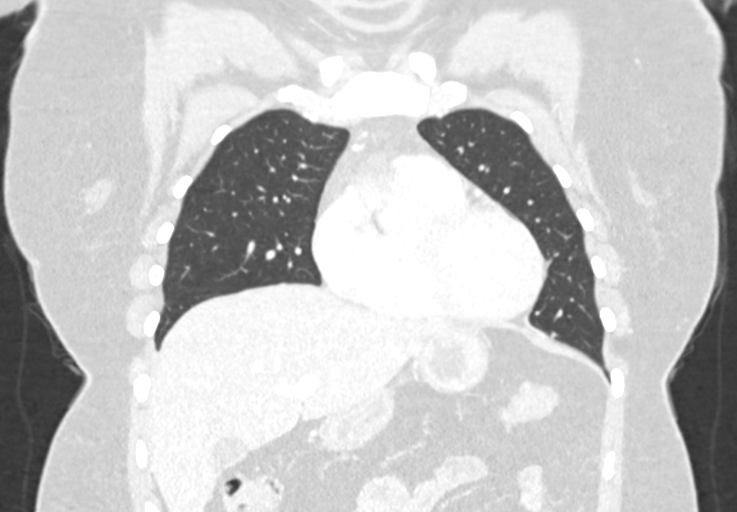
[im 88/147  lung]
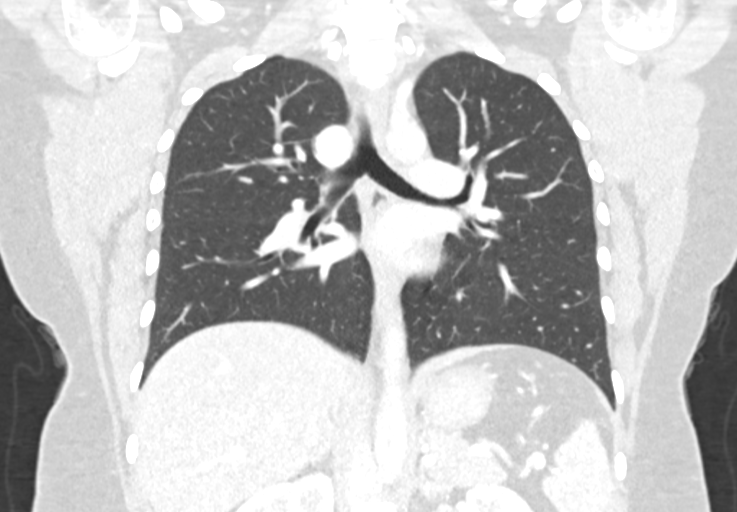

[11 of 36 positions shown; findings below may reference images not displayed]

FINDINGS: Cardiovascular: The thoracic aorta is normal without aneurysm,
atherosclerotic change, or dissection. Heart size is normal. No
coronary artery abnormalities are identified. The central pulmonary
artery is normal. The azygos vein is relatively large, proximally
the size of the descending thoracic aorta. The azygos vein drains
normally into the SVC. The azygos vein is similar in size to the
SVC. And intrahepatic portion of the IVC is not clearly visualized.
The IVC is reconstituted by the hepatic veins superiorly.

Mediastinum/Nodes: No enlarged mediastinal, hilar, or axillary lymph
nodes. Thyroid gland, trachea, and esophagus demonstrate no
significant findings.

Lungs/Pleura: Lungs are clear. No pleural effusion or pneumothorax.

Upper Abdomen: Prominent azygos vein. Rounded hyperenhancement in
the hepatic dome on series 2, image 85. Possible hepatic steatosis.
No other abnormalities in the upper abdomen.

Musculoskeletal: No chest wall abnormality. No acute or significant
osseous findings.
IMPRESSION: 1. The azygos vein is prominent. The constellation of findings
suggests this is due to azygos continuation of the inferior vena
cava.
2. Hyperenhancing lesion in the hepatic dome may represent a flash
filling hemangioma or vascular anomaly of no significance.

## 2022-08-16 DIAGNOSIS — R1031 Right lower quadrant pain: Secondary | ICD-10-CM | POA: Diagnosis not present

## 2022-08-19 ENCOUNTER — Other Ambulatory Visit: Payer: Self-pay | Admitting: Family Medicine

## 2022-08-19 DIAGNOSIS — R1031 Right lower quadrant pain: Secondary | ICD-10-CM

## 2023-01-06 DIAGNOSIS — N3001 Acute cystitis with hematuria: Secondary | ICD-10-CM | POA: Diagnosis not present

## 2023-01-17 DIAGNOSIS — N390 Urinary tract infection, site not specified: Secondary | ICD-10-CM | POA: Diagnosis not present

## 2023-01-17 DIAGNOSIS — N3001 Acute cystitis with hematuria: Secondary | ICD-10-CM | POA: Diagnosis not present

## 2023-01-17 DIAGNOSIS — R300 Dysuria: Secondary | ICD-10-CM | POA: Diagnosis not present

## 2023-01-29 DIAGNOSIS — R319 Hematuria, unspecified: Secondary | ICD-10-CM | POA: Diagnosis not present

## 2023-01-29 DIAGNOSIS — N211 Calculus in urethra: Secondary | ICD-10-CM | POA: Diagnosis not present

## 2023-01-29 DIAGNOSIS — Z87440 Personal history of urinary (tract) infections: Secondary | ICD-10-CM | POA: Diagnosis not present

## 2023-02-04 DIAGNOSIS — N9089 Other specified noninflammatory disorders of vulva and perineum: Secondary | ICD-10-CM | POA: Diagnosis not present

## 2023-02-04 DIAGNOSIS — N760 Acute vaginitis: Secondary | ICD-10-CM | POA: Diagnosis not present

## 2023-02-04 DIAGNOSIS — Z113 Encounter for screening for infections with a predominantly sexual mode of transmission: Secondary | ICD-10-CM | POA: Diagnosis not present

## 2023-05-13 DIAGNOSIS — L821 Other seborrheic keratosis: Secondary | ICD-10-CM | POA: Diagnosis not present

## 2023-05-13 DIAGNOSIS — L905 Scar conditions and fibrosis of skin: Secondary | ICD-10-CM | POA: Diagnosis not present

## 2023-05-13 DIAGNOSIS — L814 Other melanin hyperpigmentation: Secondary | ICD-10-CM | POA: Diagnosis not present

## 2023-05-13 DIAGNOSIS — D225 Melanocytic nevi of trunk: Secondary | ICD-10-CM | POA: Diagnosis not present

## 2023-05-14 ENCOUNTER — Other Ambulatory Visit: Payer: Self-pay

## 2023-05-14 ENCOUNTER — Emergency Department (HOSPITAL_BASED_OUTPATIENT_CLINIC_OR_DEPARTMENT_OTHER)
Admission: EM | Admit: 2023-05-14 | Discharge: 2023-05-14 | Disposition: A | Discharge status: Home / Self Care | Payer: BC Managed Care – PPO | Attending: Emergency Medicine | Admitting: Emergency Medicine

## 2023-05-14 ENCOUNTER — Encounter (HOSPITAL_BASED_OUTPATIENT_CLINIC_OR_DEPARTMENT_OTHER): Payer: Self-pay | Admitting: Emergency Medicine

## 2023-05-14 DIAGNOSIS — R2240 Localized swelling, mass and lump, unspecified lower limb: Secondary | ICD-10-CM | POA: Diagnosis not present

## 2023-05-14 DIAGNOSIS — M79604 Pain in right leg: Secondary | ICD-10-CM | POA: Diagnosis not present

## 2023-05-14 DIAGNOSIS — M7989 Other specified soft tissue disorders: Secondary | ICD-10-CM

## 2023-05-14 DIAGNOSIS — L821 Other seborrheic keratosis: Secondary | ICD-10-CM | POA: Diagnosis not present

## 2023-05-14 DIAGNOSIS — R9431 Abnormal electrocardiogram [ECG] [EKG]: Secondary | ICD-10-CM | POA: Diagnosis not present

## 2023-05-14 LAB — TROPONIN I (HIGH SENSITIVITY): Troponin I (High Sensitivity): <2 ng/L (ref <18)

## 2023-05-14 LAB — BASIC METABOLIC PANEL
Anion gap: 9 (ref 5–15)
BUN: 10 mg/dL (ref 6–20)
CO2: 26 mmol/L (ref 22–32)
Calcium: 9.7 mg/dL (ref 8.9–10.3)
Chloride: 103 mmol/L (ref 98–111)
Creatinine, Ser: 0.64 mg/dL (ref 0.44–1.00)
GFR, Estimated: >60 mL/min (ref >60)
Glucose, Bld: 97 mg/dL (ref 70–99)
Potassium: 3.7 mmol/L (ref 3.5–5.1)
Sodium: 138 mmol/L (ref 135–145)

## 2023-05-14 LAB — CBC WITH DIFFERENTIAL/PLATELET
Abs Immature Granulocytes: 0.03 10*3/uL (ref 0.00–0.07)
Basophils Absolute: 0.1 10*3/uL (ref 0.0–0.1)
Basophils Relative: 1 %
Eosinophils Absolute: 0.2 10*3/uL (ref 0.0–0.5)
Eosinophils Relative: 2 %
HCT: 44.1 % (ref 36.0–46.0)
Hemoglobin: 14.9 g/dL (ref 12.0–15.0)
Immature Granulocytes: 0 %
Lymphocytes Relative: 28 %
Lymphs Abs: 2.6 10*3/uL (ref 0.7–4.0)
MCH: 31.4 pg (ref 26.0–34.0)
MCHC: 33.8 g/dL (ref 30.0–36.0)
MCV: 92.8 fL (ref 80.0–100.0)
Monocytes Absolute: 0.5 10*3/uL (ref 0.1–1.0)
Monocytes Relative: 6 %
Neutro Abs: 6 10*3/uL (ref 1.7–7.7)
Neutrophils Relative %: 63 %
Platelets: 290 10*3/uL (ref 150–400)
RBC: 4.75 MIL/uL (ref 3.87–5.11)
RDW: 13.1 % (ref 11.5–15.5)
WBC: 9.4 10*3/uL (ref 4.0–10.5)
nRBC: 0 % (ref 0.0–0.2)

## 2023-05-14 LAB — D-DIMER, QUANTITATIVE: D-Dimer, Quant: 0.5 ug{FEU}/mL (ref 0.00–0.50)

## 2023-05-14 NOTE — ED Notes (Signed)
Dc instructions reviewed with patient. Patient voiced understanding. Dc with belongings.  °

## 2023-05-14 NOTE — Discharge Instructions (Signed)
While you are in the emergency department, you had test done to look for signs of a blood clot, or injury to your heart.  These tests were normal.  You can follow-up with your primary care doctor next week.

## 2023-05-14 NOTE — ED Triage Notes (Signed)
Pt states right calf has been tender for months, she was seen by dermatologist for unrelated and told that her right calf was swollen and needed eval. Last week she had an episode of epigastric pain and difficulty breathing that did resolve on its own.while talking to nurse she states, "oh now I  have a twinge in my left shoulder. " After nurse left the room, she told EMT "I also have a kidney stone".

## 2023-05-14 NOTE — ED Provider Notes (Signed)
[place] EMERGENCY DEPARTMENT AT [facility] Provider Note   CSN: [id number] Arrival date & time: 05/14/23  4098     History  No chief complaint on file.   Erika Fleming is a 37 y.o. female.  This is a 37 year old female who is here today for multiple complaints.  She says that she has had some pain and swelling in her right calf for a few months.  She went to her dermatologist yesterday to have a seborrheic keratosis looked at.  They recommended that she go to her doctor because they thought that the patient's leg was swollen.  She called her physician's office today, mention that she had some swelling in her leg, and sometimes felt some tightness in her chest.  She is also endorsing that she has felt some lumps in her breast over the last few months.  She has an appoint with her PCP next week.        Home Medications Prior to Admission medications   Medication Sig Start Date End Date Taking? Authorizing Provider  fluticasone (FLONASE) 50 MCG/ACT nasal spray Place 2 sprays into both nostrils daily. 10/04/16   [provider]  loratadine (CLARITIN) 10 MG tablet Take 10 mg by mouth daily.    [provider]  ondansetron (ZOFRAN) 4 MG tablet Take 1 tablet (4 mg total) by mouth every 8 (eight) hours as needed for nausea or vomiting. 03/08/21   Scheeler, Kermit Balo, PA-C  OVER THE COUNTER MEDICATION Eye drop prn    [provider]      Allergies    Patient has no known allergies.    Review of Systems   Review of Systems  Physical Exam Updated Vital Signs BP 133/89 (BP Location: Right Arm)   Pulse 83   Temp 97.9 F (36.6 C) (Oral)   Resp 17   LMP 09/12/2020   SpO2 100%  Physical Exam Vitals reviewed.  Cardiovascular:     Rate and Rhythm: Normal rate.     Pulses: Normal pulses.     Heart sounds: No murmur heard. Abdominal:     General: Abdomen is flat.     Palpations: Abdomen is soft.  Musculoskeletal:     Cervical back: Normal range  of motion.     Comments: I do not appreciate any lower extremity swelling  Skin:    General: Skin is warm.     Comments: Seborrheic keratosis on the right calf  Neurological:     Mental Status: She is alert.     ED Results / Procedures / Treatments   Labs (all labs ordered are listed, but only abnormal results are displayed) Labs Reviewed  D-DIMER, QUANTITATIVE  CBC WITH DIFFERENTIAL/PLATELET  BASIC METABOLIC PANEL  TROPONIN I (HIGH SENSITIVITY)    EKG None  Radiology No results found.  Procedures Procedures    Medications Ordered in ED Medications - No data to display  ED Course/ Medical Decision Making/ A&P                                 Medical Decision Making 37 year old female here today for multiple complaints.  Plan-will obtain a D-dimer on the patient, if negative will defer any ultrasound imaging of the leg.  I do not appreciate any signs nor symptoms of DVT.  Patient with clear lungs.  Vital signs normal.  Patient PERC negative.  My independent review the patient's EKG shows normal sinus  rhythm, no ST segment depressions or elevations, no T wave inversions, no evidence of acute ischemia.  Will have the patient discuss routine mammogram screening with her PCP at her appointment next week.  Reassessment 12:30 PM-negative D-dimer.  Troponin negative.  Will discharge patient have her follow-up with her PCP.  Amount and/or Complexity of Data Reviewed Labs: ordered.           Final Clinical Impression(s) / ED Diagnoses Final diagnoses:  None    Rx / DC Orders ED Discharge Orders     None         Arletha Pili, DO 05/14/23 1231

## 2023-05-20 DIAGNOSIS — F411 Generalized anxiety disorder: Secondary | ICD-10-CM | POA: Diagnosis not present

## 2023-05-20 DIAGNOSIS — Z0000 Encounter for general adult medical examination without abnormal findings: Secondary | ICD-10-CM | POA: Diagnosis not present

## 2023-05-20 DIAGNOSIS — Z8261 Family history of arthritis: Secondary | ICD-10-CM | POA: Diagnosis not present

## 2023-05-20 DIAGNOSIS — E559 Vitamin D deficiency, unspecified: Secondary | ICD-10-CM | POA: Diagnosis not present

## 2023-05-20 DIAGNOSIS — M2550 Pain in unspecified joint: Secondary | ICD-10-CM | POA: Diagnosis not present

## 2023-05-20 DIAGNOSIS — E785 Hyperlipidemia, unspecified: Secondary | ICD-10-CM | POA: Diagnosis not present

## 2023-05-20 DIAGNOSIS — Z1389 Encounter for screening for other disorder: Secondary | ICD-10-CM | POA: Diagnosis not present

## 2023-05-21 ENCOUNTER — Other Ambulatory Visit: Payer: Self-pay | Admitting: Family Medicine

## 2023-05-21 DIAGNOSIS — N6029 Fibroadenosis of unspecified breast: Secondary | ICD-10-CM

## 2023-05-21 DIAGNOSIS — N644 Mastodynia: Secondary | ICD-10-CM

## 2023-06-19 ENCOUNTER — Ambulatory Visit
Admission: RE | Admit: 2023-06-19 | Discharge: 2023-06-19 | Disposition: A | Discharge status: Home / Self Care | Payer: BC Managed Care – PPO | Source: Ambulatory Visit | Attending: Family Medicine | Admitting: Family Medicine

## 2023-06-19 ENCOUNTER — Other Ambulatory Visit: Payer: Self-pay | Admitting: Family Medicine

## 2023-06-19 DIAGNOSIS — N644 Mastodynia: Secondary | ICD-10-CM | POA: Diagnosis not present

## 2023-06-19 DIAGNOSIS — N6029 Fibroadenosis of unspecified breast: Secondary | ICD-10-CM

## 2023-06-19 DIAGNOSIS — N6310 Unspecified lump in the right breast, unspecified quadrant: Secondary | ICD-10-CM

## 2023-06-19 DIAGNOSIS — K1370 Unspecified lesions of oral mucosa: Secondary | ICD-10-CM | POA: Diagnosis not present

## 2023-06-19 DIAGNOSIS — N6315 Unspecified lump in the right breast, overlapping quadrants: Secondary | ICD-10-CM | POA: Diagnosis not present

## 2023-06-20 DIAGNOSIS — L089 Local infection of the skin and subcutaneous tissue, unspecified: Secondary | ICD-10-CM | POA: Diagnosis not present

## 2023-06-20 DIAGNOSIS — N898 Other specified noninflammatory disorders of vagina: Secondary | ICD-10-CM | POA: Diagnosis not present

## 2023-06-25 DIAGNOSIS — R8271 Bacteriuria: Secondary | ICD-10-CM | POA: Diagnosis not present

## 2023-06-25 DIAGNOSIS — R3121 Asymptomatic microscopic hematuria: Secondary | ICD-10-CM | POA: Diagnosis not present

## 2023-07-03 DIAGNOSIS — R0789 Other chest pain: Secondary | ICD-10-CM | POA: Diagnosis not present

## 2023-07-03 DIAGNOSIS — R09A2 Foreign body sensation, throat: Secondary | ICD-10-CM | POA: Diagnosis not present

## 2023-07-03 DIAGNOSIS — K148 Other diseases of tongue: Secondary | ICD-10-CM | POA: Diagnosis not present

## 2023-07-08 ENCOUNTER — Other Ambulatory Visit: Payer: Self-pay | Admitting: Family Medicine

## 2023-07-08 DIAGNOSIS — R09A2 Foreign body sensation, throat: Secondary | ICD-10-CM

## 2023-07-15 ENCOUNTER — Ambulatory Visit
Admission: RE | Admit: 2023-07-15 | Discharge: 2023-07-15 | Disposition: A | Discharge status: Home / Self Care | Payer: BC Managed Care – PPO | Source: Ambulatory Visit | Attending: Family Medicine | Admitting: Family Medicine

## 2023-07-15 DIAGNOSIS — R09A2 Foreign body sensation, throat: Secondary | ICD-10-CM

## 2023-07-23 DIAGNOSIS — E041 Nontoxic single thyroid nodule: Secondary | ICD-10-CM | POA: Diagnosis not present

## 2023-07-23 DIAGNOSIS — K1379 Other lesions of oral mucosa: Secondary | ICD-10-CM | POA: Diagnosis not present

## 2023-08-11 DIAGNOSIS — M25512 Pain in left shoulder: Secondary | ICD-10-CM | POA: Diagnosis not present

## 2023-08-11 DIAGNOSIS — M542 Cervicalgia: Secondary | ICD-10-CM | POA: Diagnosis not present

## 2023-08-23 DIAGNOSIS — K045 Chronic apical periodontitis: Secondary | ICD-10-CM | POA: Diagnosis not present

## 2023-11-20 ENCOUNTER — Other Ambulatory Visit: Payer: BC Managed Care – PPO

## 2023-12-03 DIAGNOSIS — R21 Rash and other nonspecific skin eruption: Secondary | ICD-10-CM | POA: Diagnosis not present

## 2023-12-16 DIAGNOSIS — M222X2 Patellofemoral disorders, left knee: Secondary | ICD-10-CM | POA: Diagnosis not present

## 2023-12-16 DIAGNOSIS — M222X1 Patellofemoral disorders, right knee: Secondary | ICD-10-CM | POA: Diagnosis not present

## 2023-12-16 DIAGNOSIS — M170 Bilateral primary osteoarthritis of knee: Secondary | ICD-10-CM | POA: Diagnosis not present

## 2024-01-14 ENCOUNTER — Other Ambulatory Visit
